# Patient Record
Sex: Female | Born: 1950
Health system: Southern US, Community
[De-identification: ages and names within clinical notes are randomized; demographics above are authoritative.]

## PROBLEM LIST (undated history)

## (undated) DIAGNOSIS — E785 Hyperlipidemia, unspecified: Secondary | ICD-10-CM

## (undated) DIAGNOSIS — N6092 Unspecified benign mammary dysplasia of left breast: Secondary | ICD-10-CM

## (undated) DIAGNOSIS — I10 Essential (primary) hypertension: Secondary | ICD-10-CM

## (undated) DIAGNOSIS — J45909 Unspecified asthma, uncomplicated: Secondary | ICD-10-CM

## (undated) HISTORY — PX: COLONOSCOPY: SHX174

## (undated) HISTORY — PX: ABDOMINAL HYSTERECTOMY: SHX81

## (undated) HISTORY — PX: CYSTECTOMY: SUR359

---

## 1997-05-28 ENCOUNTER — Ambulatory Visit: Admission: RE | Admit: 1997-05-28 | Discharge: 1997-05-28 | Payer: Self-pay | Admitting: General Surgery

## 1997-12-25 ENCOUNTER — Ambulatory Visit: Admission: RE | Admit: 1997-12-25 | Discharge: 1997-12-25 | Payer: Self-pay | Admitting: General Surgery

## 1997-12-25 ENCOUNTER — Encounter: Payer: Self-pay | Admitting: General Surgery

## 1999-01-02 ENCOUNTER — Encounter: Payer: Self-pay | Admitting: General Surgery

## 1999-01-02 ENCOUNTER — Encounter: Admission: RE | Admit: 1999-01-02 | Discharge: 1999-01-02 | Payer: Self-pay | Admitting: General Surgery

## 2000-08-02 ENCOUNTER — Encounter: Payer: Self-pay | Admitting: Obstetrics and Gynecology

## 2000-08-02 ENCOUNTER — Ambulatory Visit (HOSPITAL_COMMUNITY): Admission: RE | Admit: 2000-08-02 | Discharge: 2000-08-02 | Payer: Self-pay | Admitting: Obstetrics and Gynecology

## 2001-07-05 ENCOUNTER — Encounter: Payer: Self-pay | Admitting: Family Medicine

## 2001-07-05 ENCOUNTER — Ambulatory Visit (HOSPITAL_COMMUNITY): Admission: RE | Admit: 2001-07-05 | Discharge: 2001-07-05 | Payer: Self-pay | Admitting: Family Medicine

## 2001-10-31 ENCOUNTER — Emergency Department (HOSPITAL_COMMUNITY): Admission: EM | Admit: 2001-10-31 | Discharge: 2001-10-31 | Payer: Self-pay | Admitting: Emergency Medicine

## 2001-10-31 ENCOUNTER — Encounter: Payer: Self-pay | Admitting: Emergency Medicine

## 2001-12-11 ENCOUNTER — Emergency Department (HOSPITAL_COMMUNITY): Admission: EM | Admit: 2001-12-11 | Discharge: 2001-12-11 | Payer: Self-pay | Admitting: Emergency Medicine

## 2002-03-06 ENCOUNTER — Ambulatory Visit (HOSPITAL_COMMUNITY): Admission: RE | Admit: 2002-03-06 | Discharge: 2002-03-06 | Payer: Self-pay | Admitting: Pulmonary Disease

## 2003-08-12 ENCOUNTER — Emergency Department (HOSPITAL_COMMUNITY): Admission: EM | Admit: 2003-08-12 | Discharge: 2003-08-12 | Payer: Self-pay | Admitting: *Deleted

## 2003-08-14 ENCOUNTER — Emergency Department (HOSPITAL_COMMUNITY): Admission: EM | Admit: 2003-08-14 | Discharge: 2003-08-14 | Payer: Self-pay | Admitting: Family Medicine

## 2008-05-29 ENCOUNTER — Encounter: Payer: Self-pay | Admitting: Internal Medicine

## 2008-06-11 ENCOUNTER — Ambulatory Visit: Payer: Self-pay | Admitting: Internal Medicine

## 2008-06-11 ENCOUNTER — Ambulatory Visit (HOSPITAL_COMMUNITY): Admission: RE | Admit: 2008-06-11 | Discharge: 2008-06-11 | Payer: Self-pay | Admitting: Internal Medicine

## 2008-09-27 ENCOUNTER — Encounter: Payer: Self-pay | Admitting: Internal Medicine

## 2008-10-19 ENCOUNTER — Encounter: Payer: Self-pay | Admitting: Internal Medicine

## 2009-12-16 ENCOUNTER — Ambulatory Visit (HOSPITAL_COMMUNITY): Admission: RE | Admit: 2009-12-16 | Discharge: 2009-12-16 | Payer: Self-pay | Admitting: Internal Medicine

## 2010-06-03 NOTE — Op Note (Signed)
NAME:  Erika Baker, Erika Baker              ACCOUNT NO.:  [id number]   MEDICAL RECORD NO.:  [id number]          PATIENT TYPE:  AMB   LOCATION:  DAY                           FACILITY:  [facility]   PHYSICIAN:  R. Roetta Sessions, M.D. DATE OF BIRTH:  1950/11/24   DATE OF PROCEDURE:  06/11/2008  DATE OF DISCHARGE:                               OPERATIVE REPORT   PROCEDURE:  Screening colonoscopy.   INDICATIONS FOR PROCEDURE:  A 60 year old lady with no lower GI tract  symptoms sent over at the courtesy of Dr. Catalina Pizza for colorectal  cancer screening.  She is devoid of any lower GI tract symptoms.  There  is a family history of colon cancer.  She has never had her lower GI  tract imaged.  Colonoscopy is now being done as standard screening  maneuver.  Risks, benefits, alternatives, and limitations have been  discussed.  Questions were answered.  Please see the documentation in  the medical record.   PROCEDURE NOTE:  O2 saturation, blood pressure, pulse, and respirations  were monitored throughout the entire procedure.  Conscious sedation:  Versed 4 mg IV, Demerol 100 mg IV in divided doses.  Instrument:  Pentax  video chip system.   FINDINGS:  Digital rectal exam revealed no abnormalities.  The prep was  adequate.  Colon:  Colonic mucosa was surveyed from the rectosigmoid  junction, through the left transverse and right colon and the  appendiceal orifice, ileocecal valve, and cecum.  These structures were  well seen and photographed for the record.  From this level, the scope  was slowly withdrawn.  All previously mentioned mucosal surfaces were  again seen.  The colonic mucosa appeared normal.  The scope was pulled  down in the rectum where a thorough examination of the rectal mucosa  including retroflexed view of the anal verge demonstrated no  abnormalities.  The patient tolerated the procedure well and was reacted  from endoscopy.  Cecal withdrawal time 8 minutes.   IMPRESSION:  1. Normal  rectum.  2. Normal colon.   RECOMMENDATIONS:  A repeat screening colonoscopy in 10 years.      Jonathon Bellows, M.D.  Electronically Signed    RMR/MEDQ  D:  06/11/2008  T:  06/11/2008  Job:  [id number]   cc:   Catalina Pizza, M.D.  Fax: [phone]

## 2010-06-06 NOTE — Procedures (Signed)
   NAME:  Erika Baker, Erika Baker                        ACCOUNT NO.:  [id number]   MEDICAL RECORD NO.:  [id number]                   PATIENT TYPE:  OUT   LOCATION:  RESP                                 FACILITY:  [facility]   PHYSICIAN:  Edward L. Juanetta Gosling, M.D.             DATE OF BIRTH:  03/14/1950   DATE OF PROCEDURE:  DATE OF DISCHARGE:                              PULMONARY FUNCTION TEST   IMPRESSION:  1. Spirometry is normal.  2. Lung volumes are normal.  3. DLCO minimally reduced.                                               Edward L. Juanetta Gosling, M.D.    ELH/MEDQ  D:  03/07/2002  T:  03/07/2002  Job:  [id number]

## 2014-04-04 DIAGNOSIS — I10 Essential (primary) hypertension: Secondary | ICD-10-CM | POA: Diagnosis not present

## 2014-04-09 DIAGNOSIS — M79672 Pain in left foot: Secondary | ICD-10-CM | POA: Diagnosis not present

## 2014-04-09 DIAGNOSIS — E876 Hypokalemia: Secondary | ICD-10-CM | POA: Diagnosis not present

## 2014-04-09 DIAGNOSIS — I10 Essential (primary) hypertension: Secondary | ICD-10-CM | POA: Diagnosis not present

## 2014-04-09 DIAGNOSIS — E785 Hyperlipidemia, unspecified: Secondary | ICD-10-CM | POA: Diagnosis not present

## 2014-06-29 DIAGNOSIS — J449 Chronic obstructive pulmonary disease, unspecified: Secondary | ICD-10-CM | POA: Diagnosis not present

## 2014-06-29 DIAGNOSIS — I10 Essential (primary) hypertension: Secondary | ICD-10-CM | POA: Diagnosis not present

## 2014-06-29 DIAGNOSIS — J3089 Other allergic rhinitis: Secondary | ICD-10-CM | POA: Diagnosis not present

## 2014-10-09 DIAGNOSIS — E782 Mixed hyperlipidemia: Secondary | ICD-10-CM | POA: Diagnosis not present

## 2014-10-09 DIAGNOSIS — I10 Essential (primary) hypertension: Secondary | ICD-10-CM | POA: Diagnosis not present

## 2014-10-11 DIAGNOSIS — I10 Essential (primary) hypertension: Secondary | ICD-10-CM | POA: Diagnosis not present

## 2014-10-11 DIAGNOSIS — E782 Mixed hyperlipidemia: Secondary | ICD-10-CM | POA: Diagnosis not present

## 2014-10-23 DIAGNOSIS — Z1231 Encounter for screening mammogram for malignant neoplasm of breast: Secondary | ICD-10-CM | POA: Diagnosis not present

## 2014-11-08 DIAGNOSIS — I10 Essential (primary) hypertension: Secondary | ICD-10-CM | POA: Diagnosis not present

## 2014-11-08 DIAGNOSIS — Z23 Encounter for immunization: Secondary | ICD-10-CM | POA: Diagnosis not present

## 2015-05-08 DIAGNOSIS — E559 Vitamin D deficiency, unspecified: Secondary | ICD-10-CM | POA: Diagnosis not present

## 2015-05-08 DIAGNOSIS — D509 Iron deficiency anemia, unspecified: Secondary | ICD-10-CM | POA: Diagnosis not present

## 2015-05-08 DIAGNOSIS — E782 Mixed hyperlipidemia: Secondary | ICD-10-CM | POA: Diagnosis not present

## 2015-05-08 DIAGNOSIS — I10 Essential (primary) hypertension: Secondary | ICD-10-CM | POA: Diagnosis not present

## 2015-05-13 DIAGNOSIS — E785 Hyperlipidemia, unspecified: Secondary | ICD-10-CM | POA: Diagnosis not present

## 2015-05-13 DIAGNOSIS — I10 Essential (primary) hypertension: Secondary | ICD-10-CM | POA: Diagnosis not present

## 2015-06-28 DIAGNOSIS — J449 Chronic obstructive pulmonary disease, unspecified: Secondary | ICD-10-CM | POA: Diagnosis not present

## 2015-06-28 DIAGNOSIS — J3089 Other allergic rhinitis: Secondary | ICD-10-CM | POA: Diagnosis not present

## 2015-06-28 DIAGNOSIS — I10 Essential (primary) hypertension: Secondary | ICD-10-CM | POA: Diagnosis not present

## 2015-11-04 DIAGNOSIS — E782 Mixed hyperlipidemia: Secondary | ICD-10-CM | POA: Diagnosis not present

## 2015-11-06 DIAGNOSIS — Z23 Encounter for immunization: Secondary | ICD-10-CM | POA: Diagnosis not present

## 2015-11-06 DIAGNOSIS — I10 Essential (primary) hypertension: Secondary | ICD-10-CM | POA: Diagnosis not present

## 2015-11-06 DIAGNOSIS — Z0000 Encounter for general adult medical examination without abnormal findings: Secondary | ICD-10-CM | POA: Diagnosis not present

## 2015-11-06 DIAGNOSIS — Z6829 Body mass index (BMI) 29.0-29.9, adult: Secondary | ICD-10-CM | POA: Diagnosis not present

## 2015-11-06 DIAGNOSIS — E785 Hyperlipidemia, unspecified: Secondary | ICD-10-CM | POA: Diagnosis not present

## 2015-11-13 DIAGNOSIS — Z1231 Encounter for screening mammogram for malignant neoplasm of breast: Secondary | ICD-10-CM | POA: Diagnosis not present

## 2016-05-04 DIAGNOSIS — E785 Hyperlipidemia, unspecified: Secondary | ICD-10-CM | POA: Diagnosis not present

## 2016-05-04 DIAGNOSIS — I10 Essential (primary) hypertension: Secondary | ICD-10-CM | POA: Diagnosis not present

## 2016-05-06 DIAGNOSIS — E785 Hyperlipidemia, unspecified: Secondary | ICD-10-CM | POA: Diagnosis not present

## 2016-05-06 DIAGNOSIS — I10 Essential (primary) hypertension: Secondary | ICD-10-CM | POA: Diagnosis not present

## 2016-05-06 DIAGNOSIS — Z6830 Body mass index (BMI) 30.0-30.9, adult: Secondary | ICD-10-CM | POA: Diagnosis not present

## 2016-06-23 DIAGNOSIS — E669 Obesity, unspecified: Secondary | ICD-10-CM | POA: Diagnosis not present

## 2016-06-23 DIAGNOSIS — J449 Chronic obstructive pulmonary disease, unspecified: Secondary | ICD-10-CM | POA: Diagnosis not present

## 2016-06-23 DIAGNOSIS — I10 Essential (primary) hypertension: Secondary | ICD-10-CM | POA: Diagnosis not present

## 2016-06-23 DIAGNOSIS — J301 Allergic rhinitis due to pollen: Secondary | ICD-10-CM | POA: Diagnosis not present

## 2016-11-05 DIAGNOSIS — I10 Essential (primary) hypertension: Secondary | ICD-10-CM | POA: Diagnosis not present

## 2016-11-05 DIAGNOSIS — E785 Hyperlipidemia, unspecified: Secondary | ICD-10-CM | POA: Diagnosis not present

## 2016-11-09 DIAGNOSIS — E785 Hyperlipidemia, unspecified: Secondary | ICD-10-CM | POA: Diagnosis not present

## 2016-11-09 DIAGNOSIS — Z0000 Encounter for general adult medical examination without abnormal findings: Secondary | ICD-10-CM | POA: Diagnosis not present

## 2016-11-09 DIAGNOSIS — I10 Essential (primary) hypertension: Secondary | ICD-10-CM | POA: Diagnosis not present

## 2016-11-11 DIAGNOSIS — Z23 Encounter for immunization: Secondary | ICD-10-CM | POA: Diagnosis not present

## 2016-11-13 DIAGNOSIS — Z1231 Encounter for screening mammogram for malignant neoplasm of breast: Secondary | ICD-10-CM | POA: Diagnosis not present

## 2016-11-17 ENCOUNTER — Other Ambulatory Visit (HOSPITAL_COMMUNITY): Payer: Self-pay | Admitting: Internal Medicine

## 2016-11-17 DIAGNOSIS — Z780 Asymptomatic menopausal state: Secondary | ICD-10-CM

## 2016-11-26 ENCOUNTER — Other Ambulatory Visit (HOSPITAL_COMMUNITY): Payer: Self-pay

## 2016-11-26 ENCOUNTER — Encounter (HOSPITAL_COMMUNITY): Payer: Self-pay

## 2017-04-28 DIAGNOSIS — I10 Essential (primary) hypertension: Secondary | ICD-10-CM | POA: Diagnosis not present

## 2017-04-28 DIAGNOSIS — E876 Hypokalemia: Secondary | ICD-10-CM | POA: Diagnosis not present

## 2017-04-28 DIAGNOSIS — E782 Mixed hyperlipidemia: Secondary | ICD-10-CM | POA: Diagnosis not present

## 2017-04-30 DIAGNOSIS — E876 Hypokalemia: Secondary | ICD-10-CM | POA: Diagnosis not present

## 2017-04-30 DIAGNOSIS — E782 Mixed hyperlipidemia: Secondary | ICD-10-CM | POA: Diagnosis not present

## 2017-04-30 DIAGNOSIS — I10 Essential (primary) hypertension: Secondary | ICD-10-CM | POA: Diagnosis not present

## 2017-05-18 DIAGNOSIS — Z1212 Encounter for screening for malignant neoplasm of rectum: Secondary | ICD-10-CM | POA: Diagnosis not present

## 2017-05-18 DIAGNOSIS — Z1211 Encounter for screening for malignant neoplasm of colon: Secondary | ICD-10-CM | POA: Diagnosis not present

## 2017-06-23 ENCOUNTER — Ambulatory Visit (HOSPITAL_COMMUNITY)
Admission: EM | Admit: 2017-06-23 | Discharge: 2017-06-23 | Disposition: A | Discharge status: Home / Self Care | Payer: Medicare HMO | Attending: Internal Medicine | Admitting: Internal Medicine

## 2017-06-23 ENCOUNTER — Encounter (HOSPITAL_COMMUNITY): Payer: Self-pay | Admitting: Emergency Medicine

## 2017-06-23 DIAGNOSIS — J45909 Unspecified asthma, uncomplicated: Secondary | ICD-10-CM | POA: Insufficient documentation

## 2017-06-23 DIAGNOSIS — R1032 Left lower quadrant pain: Secondary | ICD-10-CM | POA: Insufficient documentation

## 2017-06-23 DIAGNOSIS — J301 Allergic rhinitis due to pollen: Secondary | ICD-10-CM | POA: Diagnosis not present

## 2017-06-23 DIAGNOSIS — I10 Essential (primary) hypertension: Secondary | ICD-10-CM | POA: Insufficient documentation

## 2017-06-23 DIAGNOSIS — R110 Nausea: Secondary | ICD-10-CM | POA: Diagnosis not present

## 2017-06-23 DIAGNOSIS — J449 Chronic obstructive pulmonary disease, unspecified: Secondary | ICD-10-CM | POA: Diagnosis not present

## 2017-06-23 DIAGNOSIS — E669 Obesity, unspecified: Secondary | ICD-10-CM | POA: Diagnosis not present

## 2017-06-23 DIAGNOSIS — Z79899 Other long term (current) drug therapy: Secondary | ICD-10-CM | POA: Diagnosis not present

## 2017-06-23 HISTORY — DX: Unspecified asthma, uncomplicated: J45.909

## 2017-06-23 HISTORY — DX: Essential (primary) hypertension: I10

## 2017-06-23 LAB — POCT URINALYSIS DIP (DEVICE)
Glucose, UA: NEGATIVE mg/dL
Ketones, ur: NEGATIVE mg/dL
NITRITE: NEGATIVE
PH: 6 (ref 5.0–8.0)
PROTEIN: 30 mg/dL — AB
Specific Gravity, Urine: 1.02 (ref 1.005–1.030)
Urobilinogen, UA: 0.2 mg/dL (ref 0.0–1.0)

## 2017-06-23 MED ORDER — CEPHALEXIN 500 MG PO CAPS: 500.0000 mg | ORAL_CAPSULE | Freq: Four times a day (QID) | ORAL | 0 refills | Status: AC

## 2017-06-23 MED ORDER — IBUPROFEN 600 MG PO TABS: 600.0000 mg | ORAL_TABLET | Freq: Four times a day (QID) | ORAL | 0 refills | Status: DC | PRN

## 2017-06-23 NOTE — Discharge Instructions (Addendum)
I am concerned about potentially urinary tract infection or kidney stone as possible source of your pain. I am starting you on antibiotics as your urine culture is pending. We would call you if changes to medications are indicated. Increase your water intake. Ibuprofen as needed for pain control. Please make appointment with your primary care clinic for recheck in the next few days. If you develop worsening of pain, vomiting, fevers or no improvement in the next 5 days please return or go to [facility] as you may need further imaging.

## 2017-06-23 NOTE — ED Provider Notes (Addendum)
[place]    CSN: [id number] Arrival date & time: 06/23/17  1246     History   Chief Complaint Chief Complaint  Patient presents with  . Abdominal Pain    HPI Erika Baker is a 67 y.o. female.   Vernell presents with complaints of LLQ abdominal pain which has been ongoing for the past three days. Much worse at night. Nausea at times but without vomiting. Denies any previous similar. No current pain. Pain started to left low back but now more to abdomen. Keeping her up at night. Has not taken any medications for pain. Eating and drinking. Normal urination. No diarrhea or constipation. States had a partial hysterectomy, still with ovaries. No vaginal symptoms. Nagging pain. No fevers. Taking two BP medications as well as atorvastatin. Hx of asthma as well.     ROS per HPI.      Past Medical History:  Diagnosis Date  . Asthma   . Hypertension     There are no active problems to display for this patient.   Past Surgical History:  Procedure Laterality Date  . ABDOMINAL HYSTERECTOMY     partial     OB History   None      Home Medications    Prior to Admission medications   Medication Sig Start Date End Date Taking? Authorizing Provider  atorvastatin (LIPITOR) 10 MG tablet Take by mouth daily.   Yes [provider]  cephALEXin (KEFLEX) 500 MG capsule Take 1 capsule (500 mg total) by mouth 4 (four) times daily for 7 days. 06/23/17 06/30/17  Zigmund Gottron, NP  ibuprofen (ADVIL,MOTRIN) 600 MG tablet Take 1 tablet (600 mg total) by mouth every 6 (six) hours as needed. 06/23/17   Zigmund Gottron, NP    Family History History reviewed. No pertinent family history.  Social History Social History   Tobacco Use  . Smoking status: Never Smoker  . Smokeless tobacco: Never Used  Substance Use Topics  . Alcohol use: Not on file  . Drug use: Not on file     Allergies   Patient has no known allergies.   Review of Systems Review of  Systems   Physical Exam Triage Vital Signs ED Triage Vitals [06/23/17 1312]  Enc Vitals Group     BP (!) 149/86     Pulse Rate 75     Resp 18     Temp 98.2 F (36.8 C)     Temp Source Oral     SpO2 97 %     Weight      Height      Head Circumference      Peak Flow      Pain Score      Pain Loc      Pain Edu?      Excl. in [place]?    No data found.  Updated Vital Signs BP (!) 149/86 (BP Location: Left Arm)   Pulse 75   Temp 98.2 F (36.8 C) (Oral)   Resp 18   SpO2 97%    Physical Exam  Constitutional: She is oriented to person, place, and time. She appears well-developed and well-nourished. No distress.  Cardiovascular: Normal rate, regular rhythm and normal heart sounds.  Pulmonary/Chest: Effort normal and breath sounds normal.  Abdominal: Soft. Bowel sounds are normal. There is no hepatosplenomegaly, splenomegaly or hepatomegaly. There is tenderness in the left lower quadrant. There is no rigidity, no rebound, no guarding, no CVA tenderness, no tenderness  at McBurney's point and negative Murphy's sign. No hernia.  Very minimal pain on palpation; patient is alert, active, without distress, ambulatory and moving around without difficulty without any objective indications of discomfort   Neurological: She is alert and oriented to person, place, and time.  Skin: Skin is warm and dry.     UC Treatments / Results  Labs (all labs ordered are listed, but only abnormal results are displayed) Labs Reviewed  POCT URINALYSIS DIP (DEVICE) - Abnormal; Notable for the following components:      Result Value   Bilirubin Urine SMALL (*)    Hgb urine dipstick SMALL (*)    Protein, ur 30 (*)    Leukocytes, UA SMALL (*)    All other components within normal limits  URINE CULTURE    EKG None  Radiology No results found.  Procedures Procedures (including critical care time)  Medications Ordered in UC Medications - No data to display  Initial Impression / Assessment and  Plan / UC Course  I have reviewed the triage vital signs and the nursing notes.  Pertinent labs & imaging results that were available during my care of the patient were reviewed by me and considered in my medical decision making (see chart for details).     Diverticulitis, ovarian cyst, kidney stone, uti considered and discussed with patient. Pain worse at night. Currently without pain. Afebrile, without tachycardia. leuks and hgb to urine, concerning for uti vs kidney stone. Opted to iniitate antibiotics at this time with close follow up with pcp as may need imagining if symptoms persist. Urine culture pending. Return precautions provided. Patient verbalized understanding and agreeable to plan.  Ambulatory out of clinic without difficulty.     Final Clinical Impressions(s) / UC Diagnoses   Final diagnoses:  Abdominal pain, left lower quadrant     Discharge Instructions     I am concerned about potentially urinary tract infection or kidney stone as possible source of your pain. I am starting you on antibiotics as your urine culture is pending. We would call you if changes to medications are indicated. Increase your water intake. Ibuprofen as needed for pain control. Please make appointment with your primary care clinic for recheck in the next few days. If you develop worsening of pain, vomiting, fevers or no improvement in the next 5 days please return or go to Er as you may need further imaging.    ED Prescriptions    Medication Sig Dispense Auth. Provider   cephALEXin (KEFLEX) 500 MG capsule Take 1 capsule (500 mg total) by mouth 4 (four) times daily for 7 days. 28 capsule Augusto Gamble B, NP   ibuprofen (ADVIL,MOTRIN) 600 MG tablet Take 1 tablet (600 mg total) by mouth every 6 (six) hours as needed. 30 tablet Zigmund Gottron, NP     Controlled Substance Prescriptions [place]rolled Sub[place] Registry consulted? Not Applicable      Zigmund Gottron, NP 06/23/17 1344

## 2017-06-23 NOTE — ED Triage Notes (Signed)
Pt sts left sided abd pain that started in back and moved around to front

## 2017-06-24 LAB — URINE CULTURE

## 2017-06-25 ENCOUNTER — Encounter (HOSPITAL_COMMUNITY): Payer: Self-pay | Admitting: *Deleted

## 2017-06-25 ENCOUNTER — Emergency Department (HOSPITAL_COMMUNITY): Payer: Medicare HMO

## 2017-06-25 ENCOUNTER — Emergency Department (HOSPITAL_COMMUNITY)
Admission: EM | Admit: 2017-06-25 | Discharge: 2017-06-25 | Disposition: A | Discharge status: Home / Self Care | Payer: Medicare HMO | Attending: Emergency Medicine | Admitting: Emergency Medicine

## 2017-06-25 ENCOUNTER — Other Ambulatory Visit: Payer: Self-pay

## 2017-06-25 DIAGNOSIS — Z79899 Other long term (current) drug therapy: Secondary | ICD-10-CM | POA: Insufficient documentation

## 2017-06-25 DIAGNOSIS — R0789 Other chest pain: Secondary | ICD-10-CM | POA: Insufficient documentation

## 2017-06-25 DIAGNOSIS — I10 Essential (primary) hypertension: Secondary | ICD-10-CM | POA: Diagnosis not present

## 2017-06-25 DIAGNOSIS — R079 Chest pain, unspecified: Secondary | ICD-10-CM | POA: Diagnosis not present

## 2017-06-25 DIAGNOSIS — R110 Nausea: Secondary | ICD-10-CM | POA: Diagnosis not present

## 2017-06-25 DIAGNOSIS — J45909 Unspecified asthma, uncomplicated: Secondary | ICD-10-CM | POA: Insufficient documentation

## 2017-06-25 LAB — BASIC METABOLIC PANEL
Anion gap: 10 (ref 5–15)
BUN: 15 mg/dL (ref 6–20)
CO2: 26 mmol/L (ref 22–32)
Calcium: 9.8 mg/dL (ref 8.9–10.3)
Chloride: 104 mmol/L (ref 101–111)
Creatinine, Ser: 0.97 mg/dL (ref 0.44–1.00)
GFR calc non Af Amer: 59 mL/min — ABNORMAL LOW (ref >60)
Glucose, Bld: 112 mg/dL — ABNORMAL HIGH (ref 65–99)
POTASSIUM: 3.6 mmol/L (ref 3.5–5.1)
SODIUM: 140 mmol/L (ref 135–145)

## 2017-06-25 LAB — I-STAT TROPONIN, ED
Troponin i, poc: 0.01 ng/mL (ref 0.00–0.08)
Troponin i, poc: 0 ng/mL (ref 0.00–0.08)

## 2017-06-25 LAB — CBC
HCT: 47.8 % — ABNORMAL HIGH (ref 36.0–46.0)
Hemoglobin: 15.9 g/dL — ABNORMAL HIGH (ref 12.0–15.0)
MCH: 30.4 pg (ref 26.0–34.0)
MCHC: 33.3 g/dL (ref 30.0–36.0)
MCV: 91.4 fL (ref 78.0–100.0)
Platelets: 256 10*3/uL (ref 150–400)
RBC: 5.23 MIL/uL — AB (ref 3.87–5.11)
RDW: 13.7 % (ref 11.5–15.5)
WBC: 7.8 10*3/uL (ref 4.0–10.5)

## 2017-06-25 LAB — HEPATIC FUNCTION PANEL
ALT: 23 U/L (ref 14–54)
AST: 28 U/L (ref 15–41)
Albumin: 4.3 g/dL (ref 3.5–5.0)
Alkaline Phosphatase: 74 U/L (ref 38–126)
Bilirubin, Direct: <0.1 mg/dL — ABNORMAL LOW (ref 0.1–0.5)
TOTAL PROTEIN: 7.2 g/dL (ref 6.5–8.1)
Total Bilirubin: 1 mg/dL (ref 0.3–1.2)

## 2017-06-25 LAB — LIPASE, BLOOD: LIPASE: 33 U/L (ref 11–51)

## 2017-06-25 MED ORDER — GI COCKTAIL ~~LOC~~
30.0000 mL | Freq: Once | ORAL | Status: AC
  Administered 2017-06-25: 30 mL via ORAL
  Filled 2017-06-25: qty 30

## 2017-06-25 MED ORDER — FAMOTIDINE 40 MG PO TABS: 40.0000 mg | ORAL_TABLET | Freq: Every day | ORAL | 0 refills | Status: DC

## 2017-06-25 NOTE — ED Provider Notes (Signed)
[facility] EMERGENCY DEPARTMENT Provider Note  CSN: [id number] Arrival date & time: 06/25/17 3664  Chief Complaint(s) Chest Pain and Abdominal Pain  HPI Erika Baker is a 67 y.o. female   The history is provided by the patient.  Chest Pain   This is a new problem. The current episode started 2 days ago. Episode frequency: nightly when she lies down. The problem has not changed since onset.The pain is present in the substernal region. The pain is mild. The quality of the pain is described as dull and pressure-like. The pain does not radiate. The symptoms are aggravated by certain positions. Associated symptoms include abdominal pain (migratory pain, mostly on the left which improves with lying down and "moves to my chest") and nausea. Pertinent negatives include no back pain, no cough, no exertional chest pressure, no fever, no leg pain, no lower extremity edema, no malaise/fatigue, no orthopnea, no shortness of breath and no vomiting. Risk factors include being elderly.  Her past medical history is significant for hyperlipidemia and hypertension.  Pertinent negatives for past medical history include no CAD, no CHF, no diabetes, no DVT, no MI, no PE and no strokes.  Abdominal Pain   Associated symptoms include nausea. Pertinent negatives include fever and vomiting.   Seen by her PCP 2 days ago for the abd pain and felt to by a possible UTI or renal stones. Rx Motrin and Keflex. Chest ain began that night.  Past Medical History Past Medical History:  Diagnosis Date  . Asthma   . Hypertension    There are no active problems to display for this patient.  Home Medication(s) Prior to Admission medications   Medication Sig Start Date End Date Taking? Authorizing Provider  amLODipine (NORVASC) 10 MG tablet Take 10 mg by mouth daily.   Yes [provider]  atorvastatin (LIPITOR) 10 MG tablet Take 40 mg by mouth daily.    Yes [provider]  cephALEXin  (KEFLEX) 500 MG capsule Take 1 capsule (500 mg total) by mouth 4 (four) times daily for 7 days. 06/23/17 06/30/17 Yes Burky, Malachy Moan, NP  ibuprofen (ADVIL,MOTRIN) 600 MG tablet Take 1 tablet (600 mg total) by mouth every 6 (six) hours as needed. Patient taking differently: Take 600 mg by mouth every 6 (six) hours as needed for moderate pain.  06/23/17  Yes Burky, Lanelle Bal B, NP  losartan (COZAAR) 25 MG tablet Take 25 mg by mouth daily.   Yes [provider]  famotidine (PEPCID) 40 MG tablet Take 1 tablet (40 mg total) by mouth daily. 06/25/17   Fatima Blank, MD                                                                                                                                    Past Surgical History Past Surgical History:  Procedure Laterality Date  . ABDOMINAL HYSTERECTOMY     partial  Family History No family history on file.  Social History Social History   Tobacco Use  . Smoking status: Never Smoker  . Smokeless tobacco: Never Used  Substance Use Topics  . Alcohol use: Not on file  . Drug use: Not on file   Allergies Patient has no known allergies.  Review of Systems Review of Systems  Constitutional: Negative for fever and malaise/fatigue.  Respiratory: Negative for cough and shortness of breath.   Cardiovascular: Positive for chest pain. Negative for orthopnea.  Gastrointestinal: Positive for abdominal pain (migratory pain, mostly on the left which improves with lying down and "moves to my chest") and nausea. Negative for vomiting.  Musculoskeletal: Negative for back pain.   All other systems are reviewed and are negative for acute change except as noted in the HPI  Physical Exam Vital Signs  I have reviewed the triage vital signs BP (!) 163/84   Pulse 67   Temp 98.4 F (36.9 C)   Resp 16   SpO2 99%   Physical Exam  Constitutional: She is oriented to person, place, and time. She appears well-developed and well-nourished. No distress.    HENT:  Head: Normocephalic and atraumatic.  Nose: Nose normal.  Eyes: Pupils are equal, round, and reactive to light. Conjunctivae and EOM are normal. Right eye exhibits no discharge. Left eye exhibits no discharge. No scleral icterus.  Neck: Normal range of motion. Neck supple.  Cardiovascular: Normal rate and regular rhythm. Exam reveals no gallop and no friction rub.  No murmur heard. Pulmonary/Chest: Effort normal and breath sounds normal. No stridor. No respiratory distress. She has no rales.  Abdominal: Soft. She exhibits no distension. There is no tenderness.  Musculoskeletal: She exhibits no edema or tenderness.  Neurological: She is alert and oriented to person, place, and time.  Skin: Skin is warm and dry. No rash noted. She is not diaphoretic. No erythema.  Psychiatric: She has a normal mood and affect.  Vitals reviewed.   ED Results and Treatments Labs (all labs ordered are listed, but only abnormal results are displayed) Labs Reviewed  BASIC METABOLIC PANEL - Abnormal; Notable for the following components:      Result Value   Glucose, Bld 112 (*)    GFR calc non Af Amer 59 (*)    All other components within normal limits  CBC - Abnormal; Notable for the following components:   RBC 5.23 (*)    Hemoglobin 15.9 (*)    HCT 47.8 (*)    All other components within normal limits  HEPATIC FUNCTION PANEL - Abnormal; Notable for the following components:   Bilirubin, Direct <0.1 (*)    All other components within normal limits  LIPASE, BLOOD  I-STAT TROPONIN, ED  I-STAT TROPONIN, ED                                                                                                                         EKG  EKG Interpretation  Date/Time: 06/25/2017  Ventricular Rate: 71   PR Interval: 166   QRS Duration:84   QT Interval: 392   QTC Calculation:425   R Axis:  60   Text Interpretation: Normal sinus rhythm. NO STEMI.  No prior tracing for comparison.       Radiology Dg Chest 2 View  Result Date: 06/25/2017 CLINICAL DATA:  Acute onset of left-sided abdominal pain. Central chest pain. EXAM: CHEST - 2 VIEW COMPARISON:  None. FINDINGS: The lungs are well-aerated. Minimal right basilar atelectasis is noted. There is no evidence of pleural effusion or pneumothorax. The heart is normal in size; the mediastinal contour is within normal limits. No acute osseous abnormalities are seen. IMPRESSION: Minimal right basilar atelectasis noted; lungs otherwise clear. Electronically Signed   By: Garald Balding M.D.   On: 06/25/2017 04:35   Pertinent labs & imaging results that were available during my care of the patient were reviewed by me and considered in my medical decision making (see chart for details).  Medications Ordered in ED Medications  gi cocktail (Maalox,Lidocaine,Donnatal) (30 mLs Oral Given 06/25/17 0549)                                                                                                                                    Procedures Procedures  (including critical care time)  Medical Decision Making / ED Course I have reviewed the nursing notes for this encounter and the patient's prior records (if available in EHR or on provided paperwork).    Atypical chest pain highly inconsistent with ACS.  EKG without acute ischemic changes or evidence of pericarditis.  Initial troponin negative.  Heart score less than 4.  Feel she is appropriate for delta troponin to rule out ACS which we will do so given her age and comorbidities.  Doubt PE, aortic dissection or esophageal perforation.  Chest x-ray without evidence suggestive of pneumonia, pneumothorax, pneumomediastinum.  No abnormal contour of the mediastinum to suggest dissection. No evidence of acute injuries.  Possible GI in nature.  She was given GI cocktail resulting in complete resolution of her symptomatology.  Rest of the labs grossly reassuring without leukocytosis, significant  electrolyte derangement.  No evidence of biliary obstruction or pancreatitis.  Delta troponin negative.  The patient appears reasonably screened and/or stabilized for discharge and I doubt any other medical condition or other [facility] requiring further screening, evaluation, or treatment in the ED at this time prior to discharge.  The patient is safe for discharge with strict return precautions.    Final Clinical Impression(s) / ED Diagnoses Final diagnoses:  Atypical chest pain   Disposition: Discharge  Condition: Good  I have discussed the results, Dx and Tx plan with the patient who expressed understanding and agree(s) with the plan. Discharge instructions discussed at great length. The patient was given strict return precautions who verbalized understanding of the instructions. No further questions at time of discharge.    ED  Discharge Orders        Ordered    famotidine (PEPCID) 40 MG tablet  Daily     06/25/17 2233       Follow Up: Celene Squibb, MD [place] [address] [phone]  Schedule an appointment as soon as possible for a visit  As needed      This chart was dictated using voice recognition software.  Despite best efforts to proofread,  errors can occur which can change the documentation meaning.   Fatima Blank, MD 06/25/17 [phone]

## 2017-06-25 NOTE — ED Triage Notes (Signed)
Pt was seen at UC 2 days ago for Lsided abd pain radiating to the front. Pt woke up this morning with centralized CP, denies sob or NV.

## 2017-07-01 ENCOUNTER — Other Ambulatory Visit (HOSPITAL_COMMUNITY): Payer: Self-pay | Admitting: Nurse Practitioner

## 2017-07-01 ENCOUNTER — Ambulatory Visit (HOSPITAL_COMMUNITY)
Admission: RE | Admit: 2017-07-01 | Discharge: 2017-07-01 | Disposition: A | Discharge status: Home / Self Care | Payer: Medicare HMO | Source: Ambulatory Visit | Attending: Nurse Practitioner | Admitting: Nurse Practitioner

## 2017-07-01 DIAGNOSIS — M545 Low back pain: Secondary | ICD-10-CM

## 2017-07-01 DIAGNOSIS — M5136 Other intervertebral disc degeneration, lumbar region: Secondary | ICD-10-CM | POA: Diagnosis not present

## 2017-07-01 DIAGNOSIS — M160 Bilateral primary osteoarthritis of hip: Secondary | ICD-10-CM | POA: Insufficient documentation

## 2017-07-01 DIAGNOSIS — M25552 Pain in left hip: Secondary | ICD-10-CM

## 2017-07-01 DIAGNOSIS — M47816 Spondylosis without myelopathy or radiculopathy, lumbar region: Secondary | ICD-10-CM | POA: Diagnosis not present

## 2017-07-01 DIAGNOSIS — M5137 Other intervertebral disc degeneration, lumbosacral region: Secondary | ICD-10-CM | POA: Insufficient documentation

## 2017-07-01 DIAGNOSIS — Z6829 Body mass index (BMI) 29.0-29.9, adult: Secondary | ICD-10-CM | POA: Diagnosis not present

## 2017-07-01 DIAGNOSIS — M4316 Spondylolisthesis, lumbar region: Secondary | ICD-10-CM | POA: Diagnosis not present

## 2017-07-01 DIAGNOSIS — L03317 Cellulitis of buttock: Secondary | ICD-10-CM | POA: Diagnosis not present

## 2017-07-01 DIAGNOSIS — M25551 Pain in right hip: Secondary | ICD-10-CM | POA: Diagnosis not present

## 2017-07-01 IMAGING — DX DG HIP (WITH OR WITHOUT PELVIS) 2-3V*L*
3 series · 3 of 3 positions shown · non-contrast
Comparison: None.

CLINICAL DATA: Back and left hip pain for 2 weeks, no injury

EXAM:
DG HIP (WITH OR WITHOUT PELVIS) 2-3V LEFT

[pelvis ap]
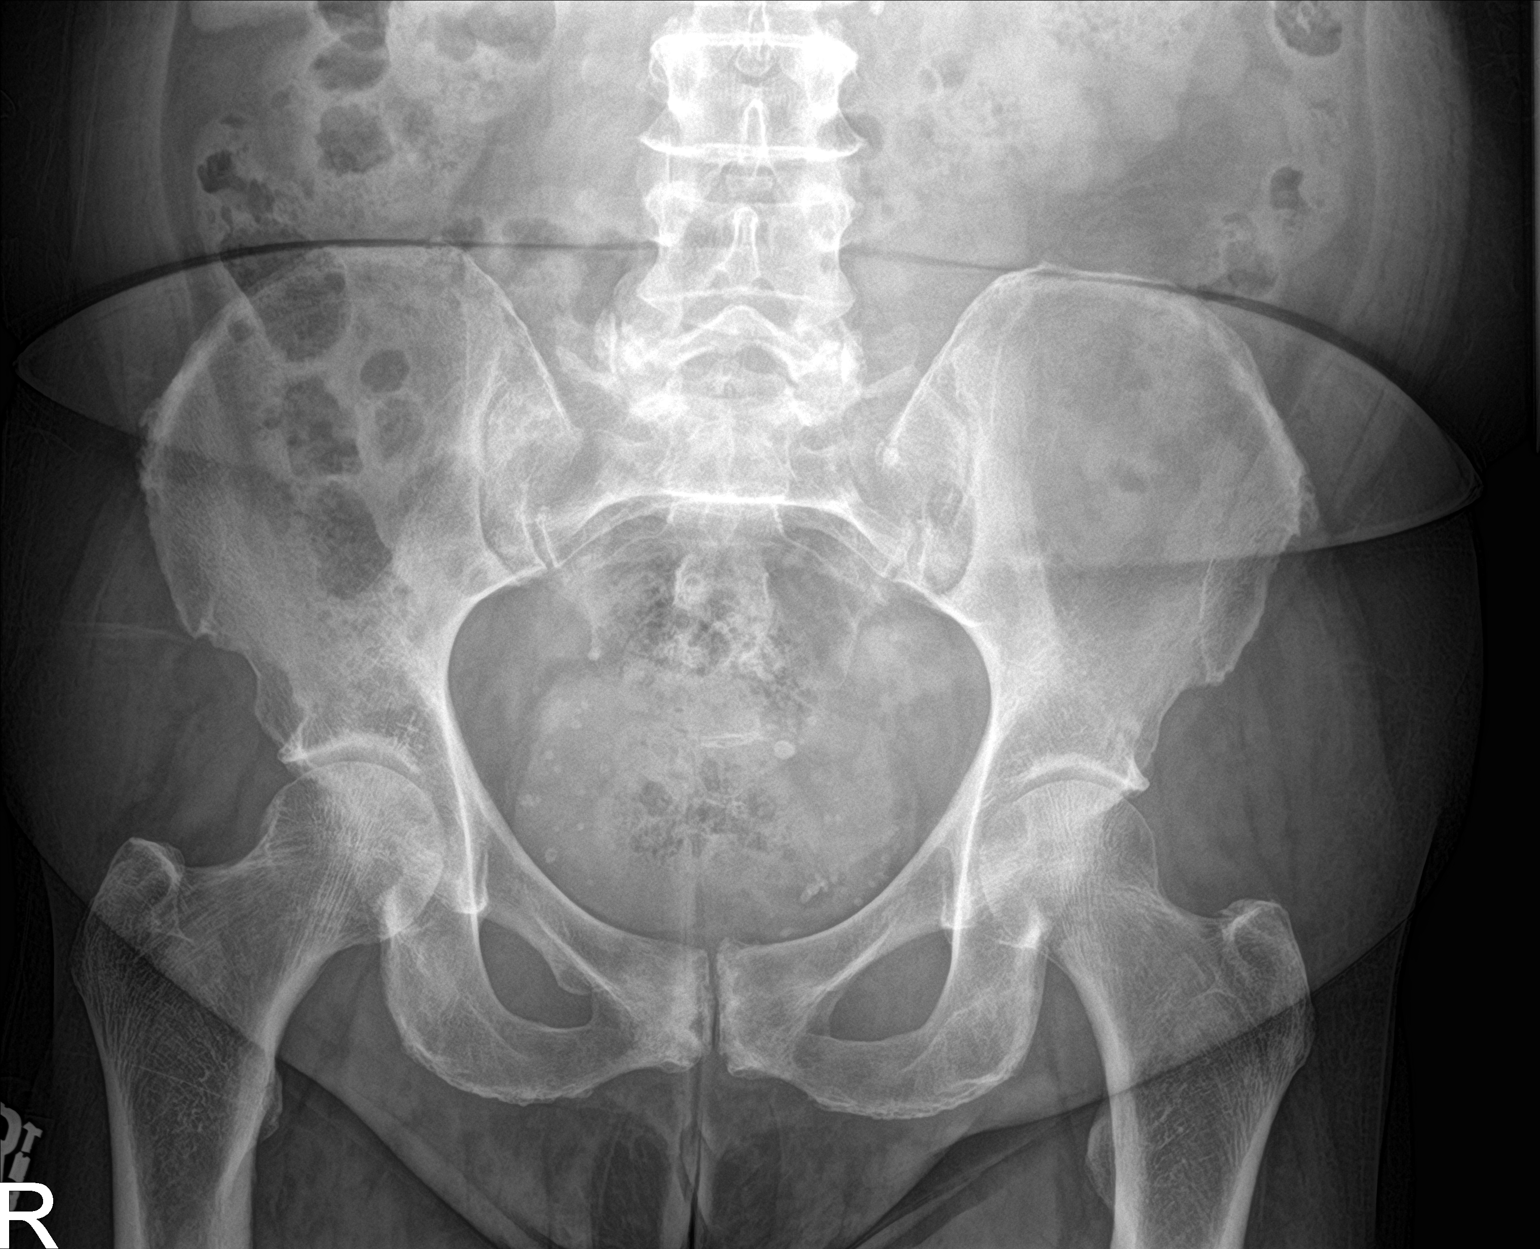

[hip ap]
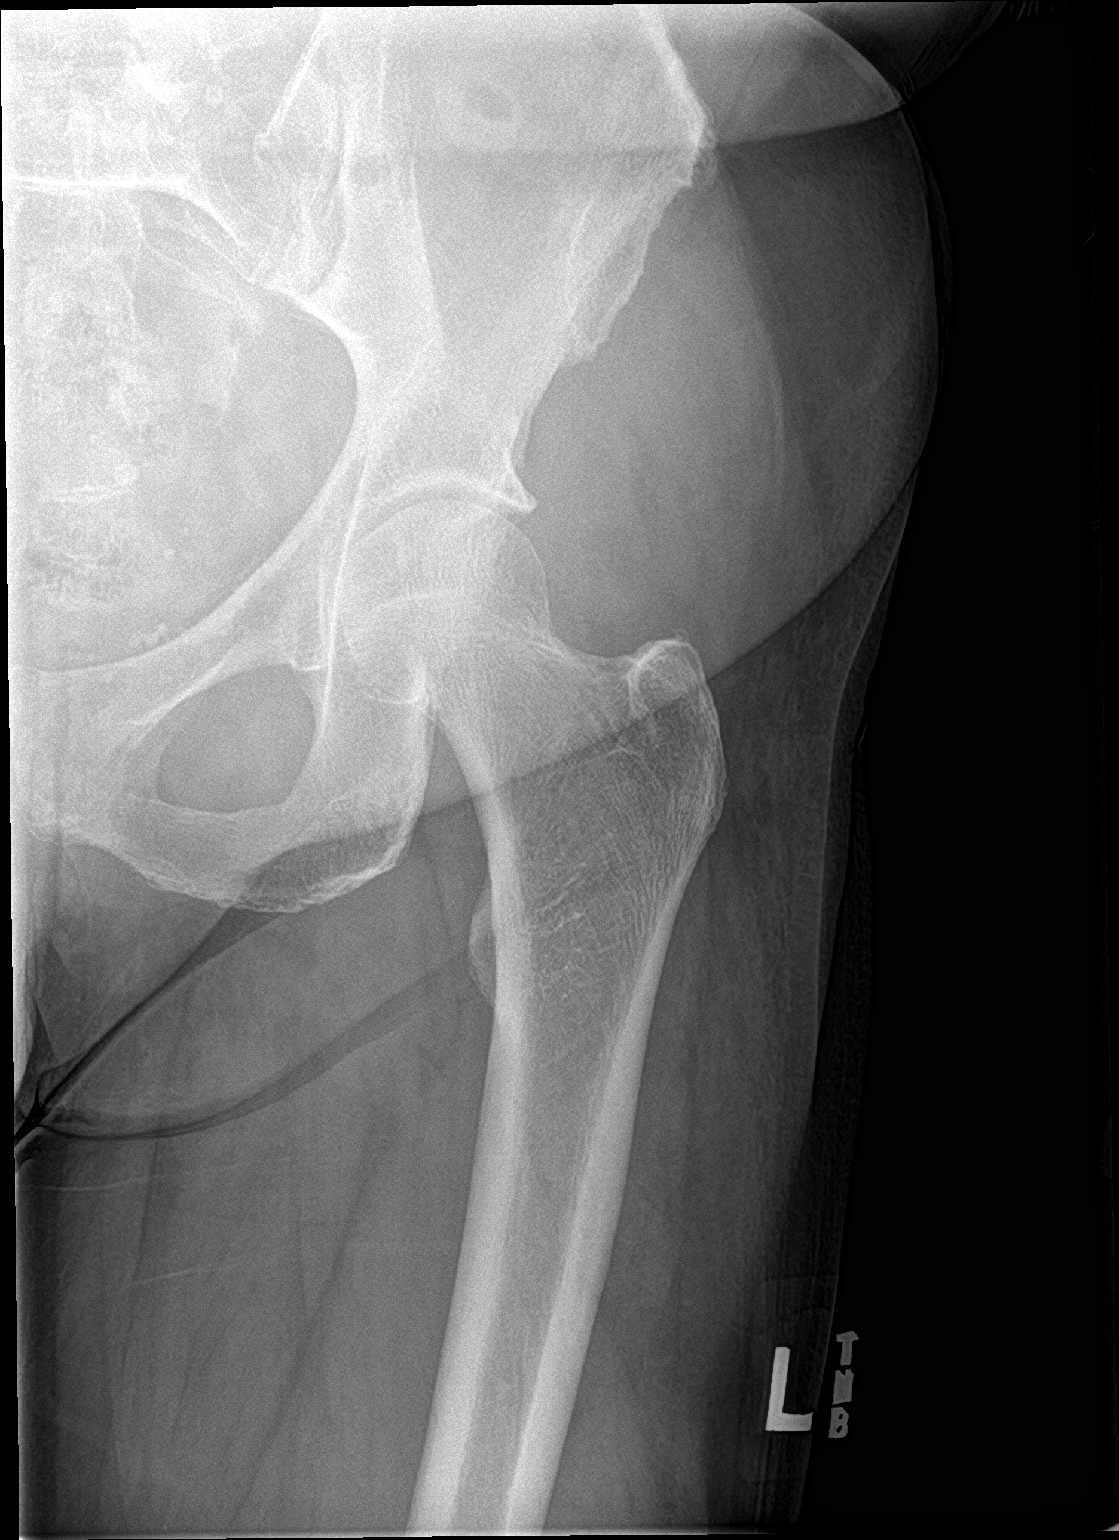

[hip lat]
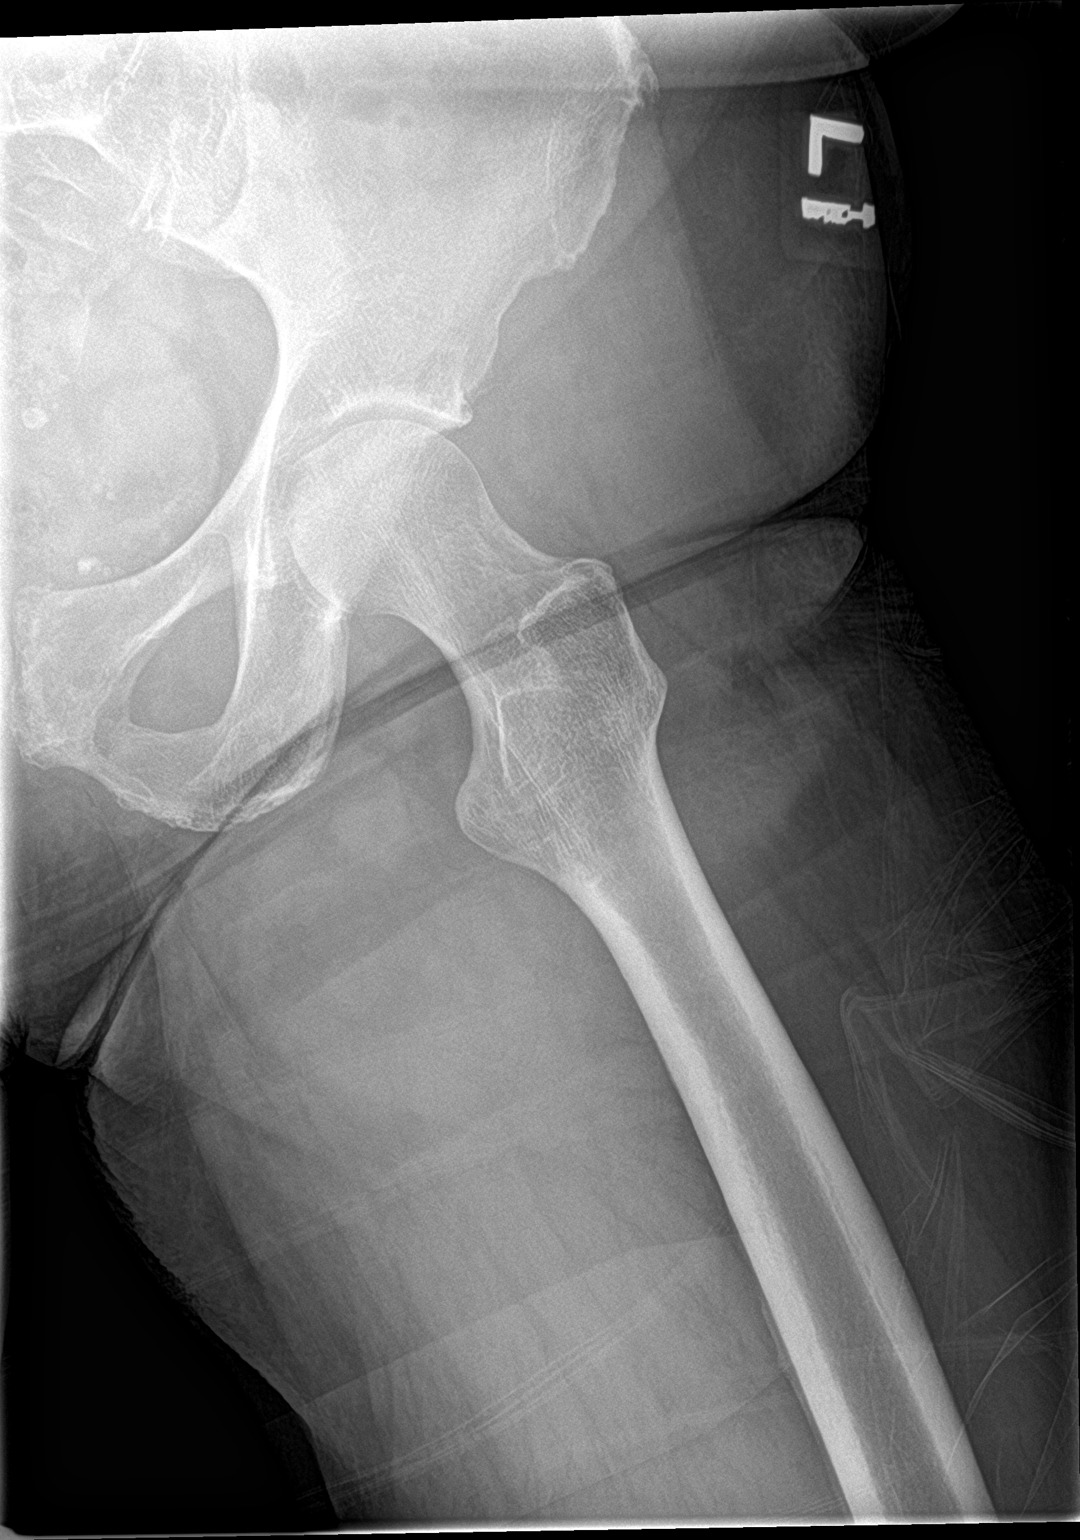

[3 of 3 positions shown; findings below may reference images not displayed]

FINDINGS: There is only mild degenerative joint disease of the hips with mild
acetabular spurring present. No fracture is seen. There is some
degenerative change of the symphysis pubis as well. But the pubic
rami appear intact. The SI joints are corticated. Calcified
phleboliths are present in the pelvis. The bowel gas pattern is
nonspecific.
IMPRESSION: 1. Only mild degenerative joint disease of the hips. No acute
abnormality.
2. Mild degenerative change of the pubic symphysis.

## 2017-07-15 DIAGNOSIS — Z6829 Body mass index (BMI) 29.0-29.9, adult: Secondary | ICD-10-CM | POA: Diagnosis not present

## 2017-07-15 DIAGNOSIS — E782 Mixed hyperlipidemia: Secondary | ICD-10-CM | POA: Diagnosis not present

## 2017-07-15 DIAGNOSIS — E876 Hypokalemia: Secondary | ICD-10-CM | POA: Diagnosis not present

## 2017-07-15 DIAGNOSIS — L03317 Cellulitis of buttock: Secondary | ICD-10-CM | POA: Diagnosis not present

## 2017-07-15 DIAGNOSIS — I10 Essential (primary) hypertension: Secondary | ICD-10-CM | POA: Diagnosis not present

## 2017-07-15 DIAGNOSIS — R100 Acute abdomen: Secondary | ICD-10-CM | POA: Diagnosis not present

## 2017-11-09 DIAGNOSIS — E559 Vitamin D deficiency, unspecified: Secondary | ICD-10-CM | POA: Diagnosis not present

## 2017-11-09 DIAGNOSIS — E782 Mixed hyperlipidemia: Secondary | ICD-10-CM | POA: Diagnosis not present

## 2017-11-09 DIAGNOSIS — I10 Essential (primary) hypertension: Secondary | ICD-10-CM | POA: Diagnosis not present

## 2017-11-09 DIAGNOSIS — E876 Hypokalemia: Secondary | ICD-10-CM | POA: Diagnosis not present

## 2017-11-12 DIAGNOSIS — Z6830 Body mass index (BMI) 30.0-30.9, adult: Secondary | ICD-10-CM | POA: Diagnosis not present

## 2017-11-12 DIAGNOSIS — I10 Essential (primary) hypertension: Secondary | ICD-10-CM | POA: Diagnosis not present

## 2017-11-12 DIAGNOSIS — Z0000 Encounter for general adult medical examination without abnormal findings: Secondary | ICD-10-CM | POA: Diagnosis not present

## 2017-11-12 DIAGNOSIS — Z23 Encounter for immunization: Secondary | ICD-10-CM | POA: Diagnosis not present

## 2017-11-12 DIAGNOSIS — E782 Mixed hyperlipidemia: Secondary | ICD-10-CM | POA: Diagnosis not present

## 2017-11-16 ENCOUNTER — Other Ambulatory Visit: Payer: Self-pay | Admitting: Adult Health Nurse Practitioner

## 2017-11-16 DIAGNOSIS — R5381 Other malaise: Secondary | ICD-10-CM

## 2017-11-17 DIAGNOSIS — Z1231 Encounter for screening mammogram for malignant neoplasm of breast: Secondary | ICD-10-CM | POA: Diagnosis not present

## 2017-11-24 ENCOUNTER — Other Ambulatory Visit: Payer: Self-pay | Admitting: Adult Health Nurse Practitioner

## 2017-11-24 DIAGNOSIS — E2839 Other primary ovarian failure: Secondary | ICD-10-CM

## 2017-12-07 ENCOUNTER — Ambulatory Visit
Admission: RE | Admit: 2017-12-07 | Discharge: 2017-12-07 | Disposition: A | Discharge status: Home / Self Care | Payer: Medicare HMO | Source: Ambulatory Visit | Attending: Adult Health Nurse Practitioner | Admitting: Adult Health Nurse Practitioner

## 2017-12-07 DIAGNOSIS — E2839 Other primary ovarian failure: Secondary | ICD-10-CM

## 2017-12-07 DIAGNOSIS — M85851 Other specified disorders of bone density and structure, right thigh: Secondary | ICD-10-CM | POA: Diagnosis not present

## 2018-05-13 DIAGNOSIS — E782 Mixed hyperlipidemia: Secondary | ICD-10-CM | POA: Diagnosis not present

## 2018-05-13 DIAGNOSIS — E559 Vitamin D deficiency, unspecified: Secondary | ICD-10-CM | POA: Diagnosis not present

## 2018-05-13 DIAGNOSIS — I10 Essential (primary) hypertension: Secondary | ICD-10-CM | POA: Diagnosis not present

## 2018-05-17 DIAGNOSIS — E559 Vitamin D deficiency, unspecified: Secondary | ICD-10-CM | POA: Diagnosis not present

## 2018-05-17 DIAGNOSIS — E782 Mixed hyperlipidemia: Secondary | ICD-10-CM | POA: Diagnosis not present

## 2018-05-17 DIAGNOSIS — E870 Hyperosmolality and hypernatremia: Secondary | ICD-10-CM | POA: Diagnosis not present

## 2018-05-17 DIAGNOSIS — I10 Essential (primary) hypertension: Secondary | ICD-10-CM | POA: Diagnosis not present

## 2018-05-20 DIAGNOSIS — Z0000 Encounter for general adult medical examination without abnormal findings: Secondary | ICD-10-CM | POA: Diagnosis not present

## 2018-05-25 ENCOUNTER — Encounter: Payer: Self-pay | Admitting: Internal Medicine

## 2018-06-29 DIAGNOSIS — J683 Other acute and subacute respiratory conditions due to chemicals, gases, fumes and vapors: Secondary | ICD-10-CM | POA: Diagnosis not present

## 2018-06-29 DIAGNOSIS — J301 Allergic rhinitis due to pollen: Secondary | ICD-10-CM | POA: Diagnosis not present

## 2018-06-29 DIAGNOSIS — I10 Essential (primary) hypertension: Secondary | ICD-10-CM | POA: Diagnosis not present

## 2018-11-17 DIAGNOSIS — I10 Essential (primary) hypertension: Secondary | ICD-10-CM | POA: Diagnosis not present

## 2018-11-17 DIAGNOSIS — E782 Mixed hyperlipidemia: Secondary | ICD-10-CM | POA: Diagnosis not present

## 2018-11-17 DIAGNOSIS — E559 Vitamin D deficiency, unspecified: Secondary | ICD-10-CM | POA: Diagnosis not present

## 2018-11-21 DIAGNOSIS — Z1231 Encounter for screening mammogram for malignant neoplasm of breast: Secondary | ICD-10-CM | POA: Diagnosis not present

## 2018-11-21 DIAGNOSIS — Z0000 Encounter for general adult medical examination without abnormal findings: Secondary | ICD-10-CM | POA: Diagnosis not present

## 2018-11-21 DIAGNOSIS — I10 Essential (primary) hypertension: Secondary | ICD-10-CM | POA: Diagnosis not present

## 2018-11-21 DIAGNOSIS — E559 Vitamin D deficiency, unspecified: Secondary | ICD-10-CM | POA: Diagnosis not present

## 2018-11-21 DIAGNOSIS — E870 Hyperosmolality and hypernatremia: Secondary | ICD-10-CM | POA: Diagnosis not present

## 2018-11-21 DIAGNOSIS — Z23 Encounter for immunization: Secondary | ICD-10-CM | POA: Diagnosis not present

## 2018-11-21 DIAGNOSIS — E782 Mixed hyperlipidemia: Secondary | ICD-10-CM | POA: Diagnosis not present

## 2019-02-23 DIAGNOSIS — E782 Mixed hyperlipidemia: Secondary | ICD-10-CM | POA: Diagnosis not present

## 2019-02-23 DIAGNOSIS — E7849 Other hyperlipidemia: Secondary | ICD-10-CM | POA: Diagnosis not present

## 2019-02-23 DIAGNOSIS — I10 Essential (primary) hypertension: Secondary | ICD-10-CM | POA: Diagnosis not present

## 2019-02-23 DIAGNOSIS — E876 Hypokalemia: Secondary | ICD-10-CM | POA: Diagnosis not present

## 2019-03-30 DIAGNOSIS — E7849 Other hyperlipidemia: Secondary | ICD-10-CM | POA: Diagnosis not present

## 2019-03-30 DIAGNOSIS — E782 Mixed hyperlipidemia: Secondary | ICD-10-CM | POA: Diagnosis not present

## 2019-03-30 DIAGNOSIS — I10 Essential (primary) hypertension: Secondary | ICD-10-CM | POA: Diagnosis not present

## 2019-03-30 DIAGNOSIS — E876 Hypokalemia: Secondary | ICD-10-CM | POA: Diagnosis not present

## 2019-06-01 DIAGNOSIS — M25551 Pain in right hip: Secondary | ICD-10-CM | POA: Diagnosis not present

## 2019-06-01 DIAGNOSIS — E870 Hyperosmolality and hypernatremia: Secondary | ICD-10-CM | POA: Diagnosis not present

## 2019-06-01 DIAGNOSIS — I10 Essential (primary) hypertension: Secondary | ICD-10-CM | POA: Diagnosis not present

## 2019-06-01 DIAGNOSIS — E7849 Other hyperlipidemia: Secondary | ICD-10-CM | POA: Diagnosis not present

## 2019-06-01 DIAGNOSIS — E559 Vitamin D deficiency, unspecified: Secondary | ICD-10-CM | POA: Diagnosis not present

## 2019-06-01 DIAGNOSIS — Z0000 Encounter for general adult medical examination without abnormal findings: Secondary | ICD-10-CM | POA: Diagnosis not present

## 2019-06-01 DIAGNOSIS — E782 Mixed hyperlipidemia: Secondary | ICD-10-CM | POA: Diagnosis not present

## 2019-06-01 DIAGNOSIS — E876 Hypokalemia: Secondary | ICD-10-CM | POA: Diagnosis not present

## 2019-06-01 DIAGNOSIS — L03317 Cellulitis of buttock: Secondary | ICD-10-CM | POA: Diagnosis not present

## 2019-06-06 ENCOUNTER — Other Ambulatory Visit (HOSPITAL_COMMUNITY): Payer: Self-pay | Admitting: Internal Medicine

## 2019-06-06 DIAGNOSIS — R7303 Prediabetes: Secondary | ICD-10-CM | POA: Diagnosis not present

## 2019-06-06 DIAGNOSIS — I10 Essential (primary) hypertension: Secondary | ICD-10-CM | POA: Diagnosis not present

## 2019-06-06 DIAGNOSIS — E663 Overweight: Secondary | ICD-10-CM | POA: Diagnosis not present

## 2019-06-06 DIAGNOSIS — Z0001 Encounter for general adult medical examination with abnormal findings: Secondary | ICD-10-CM | POA: Diagnosis not present

## 2019-06-06 DIAGNOSIS — Z136 Encounter for screening for cardiovascular disorders: Secondary | ICD-10-CM | POA: Diagnosis not present

## 2019-06-06 DIAGNOSIS — Z6828 Body mass index (BMI) 28.0-28.9, adult: Secondary | ICD-10-CM | POA: Diagnosis not present

## 2019-06-06 DIAGNOSIS — E559 Vitamin D deficiency, unspecified: Secondary | ICD-10-CM | POA: Diagnosis not present

## 2019-06-06 DIAGNOSIS — E782 Mixed hyperlipidemia: Secondary | ICD-10-CM | POA: Diagnosis not present

## 2019-06-06 DIAGNOSIS — M810 Age-related osteoporosis without current pathological fracture: Secondary | ICD-10-CM

## 2019-06-06 DIAGNOSIS — M205X9 Other deformities of toe(s) (acquired), unspecified foot: Secondary | ICD-10-CM | POA: Diagnosis not present

## 2019-06-06 DIAGNOSIS — E870 Hyperosmolality and hypernatremia: Secondary | ICD-10-CM | POA: Diagnosis not present

## 2019-06-20 DIAGNOSIS — I10 Essential (primary) hypertension: Secondary | ICD-10-CM | POA: Diagnosis not present

## 2019-08-22 DIAGNOSIS — I10 Essential (primary) hypertension: Secondary | ICD-10-CM | POA: Diagnosis not present

## 2019-08-22 DIAGNOSIS — E876 Hypokalemia: Secondary | ICD-10-CM | POA: Diagnosis not present

## 2019-08-22 DIAGNOSIS — E7849 Other hyperlipidemia: Secondary | ICD-10-CM | POA: Diagnosis not present

## 2019-08-22 DIAGNOSIS — E782 Mixed hyperlipidemia: Secondary | ICD-10-CM | POA: Diagnosis not present

## 2019-10-05 DIAGNOSIS — E7849 Other hyperlipidemia: Secondary | ICD-10-CM | POA: Diagnosis not present

## 2019-10-05 DIAGNOSIS — E876 Hypokalemia: Secondary | ICD-10-CM | POA: Diagnosis not present

## 2019-10-05 DIAGNOSIS — I10 Essential (primary) hypertension: Secondary | ICD-10-CM | POA: Diagnosis not present

## 2019-10-05 DIAGNOSIS — E782 Mixed hyperlipidemia: Secondary | ICD-10-CM | POA: Diagnosis not present

## 2019-10-27 DIAGNOSIS — E782 Mixed hyperlipidemia: Secondary | ICD-10-CM | POA: Diagnosis not present

## 2019-10-27 DIAGNOSIS — I10 Essential (primary) hypertension: Secondary | ICD-10-CM | POA: Diagnosis not present

## 2019-10-27 DIAGNOSIS — E7849 Other hyperlipidemia: Secondary | ICD-10-CM | POA: Diagnosis not present

## 2019-10-27 DIAGNOSIS — E876 Hypokalemia: Secondary | ICD-10-CM | POA: Diagnosis not present

## 2019-11-27 DIAGNOSIS — Z1231 Encounter for screening mammogram for malignant neoplasm of breast: Secondary | ICD-10-CM | POA: Diagnosis not present

## 2019-12-04 DIAGNOSIS — H04123 Dry eye syndrome of bilateral lacrimal glands: Secondary | ICD-10-CM | POA: Diagnosis not present

## 2019-12-04 DIAGNOSIS — H1045 Other chronic allergic conjunctivitis: Secondary | ICD-10-CM | POA: Diagnosis not present

## 2019-12-04 DIAGNOSIS — H25813 Combined forms of age-related cataract, bilateral: Secondary | ICD-10-CM | POA: Diagnosis not present

## 2019-12-05 DIAGNOSIS — E559 Vitamin D deficiency, unspecified: Secondary | ICD-10-CM | POA: Diagnosis not present

## 2019-12-05 DIAGNOSIS — Z0000 Encounter for general adult medical examination without abnormal findings: Secondary | ICD-10-CM | POA: Diagnosis not present

## 2019-12-05 DIAGNOSIS — Z136 Encounter for screening for cardiovascular disorders: Secondary | ICD-10-CM | POA: Diagnosis not present

## 2019-12-05 DIAGNOSIS — E663 Overweight: Secondary | ICD-10-CM | POA: Diagnosis not present

## 2019-12-05 DIAGNOSIS — M205X9 Other deformities of toe(s) (acquired), unspecified foot: Secondary | ICD-10-CM | POA: Diagnosis not present

## 2019-12-05 DIAGNOSIS — R7303 Prediabetes: Secondary | ICD-10-CM | POA: Diagnosis not present

## 2019-12-05 DIAGNOSIS — Z0001 Encounter for general adult medical examination with abnormal findings: Secondary | ICD-10-CM | POA: Diagnosis not present

## 2019-12-05 DIAGNOSIS — Z6828 Body mass index (BMI) 28.0-28.9, adult: Secondary | ICD-10-CM | POA: Diagnosis not present

## 2019-12-05 DIAGNOSIS — I10 Essential (primary) hypertension: Secondary | ICD-10-CM | POA: Diagnosis not present

## 2019-12-05 DIAGNOSIS — E876 Hypokalemia: Secondary | ICD-10-CM | POA: Diagnosis not present

## 2019-12-12 DIAGNOSIS — E870 Hyperosmolality and hypernatremia: Secondary | ICD-10-CM | POA: Diagnosis not present

## 2019-12-12 DIAGNOSIS — Z23 Encounter for immunization: Secondary | ICD-10-CM | POA: Diagnosis not present

## 2019-12-12 DIAGNOSIS — R7303 Prediabetes: Secondary | ICD-10-CM | POA: Diagnosis not present

## 2019-12-12 DIAGNOSIS — Z6829 Body mass index (BMI) 29.0-29.9, adult: Secondary | ICD-10-CM | POA: Diagnosis not present

## 2019-12-12 DIAGNOSIS — M205X9 Other deformities of toe(s) (acquired), unspecified foot: Secondary | ICD-10-CM | POA: Diagnosis not present

## 2019-12-12 DIAGNOSIS — E782 Mixed hyperlipidemia: Secondary | ICD-10-CM | POA: Diagnosis not present

## 2019-12-12 DIAGNOSIS — E559 Vitamin D deficiency, unspecified: Secondary | ICD-10-CM | POA: Diagnosis not present

## 2019-12-12 DIAGNOSIS — I10 Essential (primary) hypertension: Secondary | ICD-10-CM | POA: Diagnosis not present

## 2019-12-12 DIAGNOSIS — E663 Overweight: Secondary | ICD-10-CM | POA: Diagnosis not present

## 2019-12-13 DIAGNOSIS — I10 Essential (primary) hypertension: Secondary | ICD-10-CM | POA: Diagnosis not present

## 2019-12-13 DIAGNOSIS — E782 Mixed hyperlipidemia: Secondary | ICD-10-CM | POA: Diagnosis not present

## 2019-12-18 DIAGNOSIS — R928 Other abnormal and inconclusive findings on diagnostic imaging of breast: Secondary | ICD-10-CM | POA: Diagnosis not present

## 2019-12-18 DIAGNOSIS — R921 Mammographic calcification found on diagnostic imaging of breast: Secondary | ICD-10-CM | POA: Diagnosis not present

## 2019-12-18 DIAGNOSIS — R922 Inconclusive mammogram: Secondary | ICD-10-CM | POA: Diagnosis not present

## 2020-01-02 DIAGNOSIS — I10 Essential (primary) hypertension: Secondary | ICD-10-CM | POA: Diagnosis not present

## 2020-01-04 DIAGNOSIS — R921 Mammographic calcification found on diagnostic imaging of breast: Secondary | ICD-10-CM | POA: Diagnosis not present

## 2020-01-04 DIAGNOSIS — N6092 Unspecified benign mammary dysplasia of left breast: Secondary | ICD-10-CM | POA: Diagnosis not present

## 2020-01-19 DIAGNOSIS — E782 Mixed hyperlipidemia: Secondary | ICD-10-CM | POA: Diagnosis not present

## 2020-01-19 DIAGNOSIS — I10 Essential (primary) hypertension: Secondary | ICD-10-CM | POA: Diagnosis not present

## 2020-02-06 ENCOUNTER — Ambulatory Visit: Payer: Self-pay | Admitting: Surgery

## 2020-02-06 DIAGNOSIS — N6092 Unspecified benign mammary dysplasia of left breast: Secondary | ICD-10-CM | POA: Diagnosis not present

## 2020-02-06 NOTE — H&P (View-Only) (Signed)
History of Present Illness Erika Baker. Mauri Temkin MD; 02/06/2020 2:51 PM) The patient is a 70 year old female who presents with a breast mass. PCP - Dr. Delphina Cahill Referred by for left breast ADH  This is a 70 year old female with a history of hypertension but no family history of breast cancer or breast problems who presents with a finding on a recent routine screening mammogram. In the left breast at 9:00 middle depth 6.5 cm from the nipple she was found to have a 0.5 cm group of calcifications. Subsequent imaging showed a 7 mm area that was subsequently biopsied under stereotactic guidance. Pathology showed atypical ductal hyperplasia with calcifications. She is referred to [place] for surgical evaluation.  The patient denies any previous breast surgery or breast problems. No complaints prior to her mammogram. No family history of breast cancer. She has had a hysterectomy but retains her ovaries.   Past Surgical History [name], LPN; 1/61/0960 4:54 PM) Colon Polyp Removal - Colonoscopy Hysterectomy (not due to cancer) - Partial  Diagnostic Studies History [name], LPN; 0/98/1191 4:78 PM) Colonoscopy >10 years ago Mammogram within last year Pap Smear >5 years ago  Allergies [name], LPN; 2/95/6213 0:86 PM) No Known Drug Allergies [02/06/2020]: Allergies Reconciled  Medication History [name], LPN; 5/78/4696 2:95 PM) amLODIPine Besylate (5MG  Tablet, Oral) Active. Atorvastatin Calcium (40MG  Tablet, Oral) Active. Losartan Potassium (100MG  Tablet, Oral) Active. Vitamin D3 (125 MCG(5000 UT) Capsule, Oral) Active. Medications Reconciled  Social History [name], LPN; 2/84/1324 4:01 PM) Caffeine use Carbonated beverages, Tea. No alcohol use No drug use Tobacco use Never smoker.  Family History [name], LPN; 0/27/2536 6:44 PM) Hypertension Mother, Sister. Kidney Disease Family Members In General.  Pregnancy / Birth History [name], LPN; 0/34/7425 9:56 PM) Age of menopause <43 Gravida 0  Other Problems [name], LPN; 3/87/5643 3:29 PM) Asthma High blood pressure Hypercholesterolemia     Review of Systems Claiborne Billings Dockery LPN; 06/06/8414 6:06 PM) General Not Present- Appetite Loss, Chills, Fatigue, Fever, Night Sweats, Weight Gain and Weight Loss. Skin Not Present- Change in Wart/Mole, Dryness, Hives, Jaundice, New Lesions, Non-Healing Wounds, Rash and Ulcer. HEENT Not Present- Earache, Hearing Loss, Hoarseness, Nose Bleed, Oral Ulcers, Ringing in the Ears, Seasonal Allergies, Sinus Pain, Sore Throat, Visual Disturbances, Wears glasses/contact lenses and Yellow Eyes. Respiratory Not Present- Bloody sputum, Chronic Cough, Difficulty Breathing, Snoring and Wheezing. Breast Not Present- Breast Mass, Breast Pain, Nipple Discharge and Skin Changes. Cardiovascular Not Present- Chest Pain, Difficulty Breathing Lying Down, Leg Cramps, Palpitations, Rapid Heart Rate, Shortness of Breath and Swelling of Extremities. Gastrointestinal Not Present- Abdominal Pain, Bloating, Bloody Stool, Change in Bowel Habits, Chronic diarrhea, Constipation, Difficulty Swallowing, Excessive gas, Gets full quickly at meals, Hemorrhoids, Indigestion, Nausea, Rectal Pain and Vomiting. Female Genitourinary Not Present- Frequency, Nocturia, Painful Urination, Pelvic Pain and Urgency. Musculoskeletal Not Present- Back Pain, Joint Pain, Joint Stiffness, Muscle Pain, Muscle Weakness and Swelling of Extremities. Neurological Not Present- Decreased Memory, Fainting, Headaches, Numbness, Seizures, Tingling, Tremor, Trouble walking and Weakness. Psychiatric Not Present- Anxiety, Bipolar, Change in Sleep Pattern, Depression, Fearful and Frequent crying. Endocrine Not Present- Cold Intolerance, Excessive Hunger, Hair Changes, Heat Intolerance, Hot flashes and New Diabetes. Hematology Not Present- Blood Thinners, Easy Bruising, Excessive bleeding,  Gland problems, HIV and Persistent Infections.  Vitals Claiborne Billings Dockery LPN; 03/19/6008 9:32 PM) 02/06/2020 2:43 PM Weight: 171.4 lb Height: 62in Body Surface Area: 1.79 m Body Mass Index: 31.35 kg/m  Pulse: 114 (Regular)  BP: 142/80(Sitting, Left Arm,  Standard)        Physical Exam Rodman Key K. Stela Iwasaki MD; 02/06/2020 2:51 PM)  The physical exam findings are as follows: Note:Constitutional: WDWN in NAD, conversant, no obvious deformities; resting comfortably Eyes: Pupils equal, round; sclera anicteric; moist conjunctiva; no lid lag HENT: Oral mucosa moist; good dentition Neck: No masses palpated, trachea midline; no thyromegaly Lungs: CTA bilaterally; normal respiratory effort Breasts: symmetric, no nipple changes; no palpable masses; no axillary lymphadenopathy CV: Regular rate and rhythm; no murmurs; extremities well-perfused with no edema Abd: +bowel sounds, soft, non-tender, no palpable organomegaly; no palpable hernias Musc: Normal gait; no apparent clubbing or cyanosis in extremities Lymphatic: No palpable cervical or axillary lymphadenopathy Skin: Warm, dry; no sign of jaundice Psychiatric - alert and oriented x 4; calm mood and affect    Assessment & Plan Rodman Key K. Zuha Dejonge MD; 02/06/2020 2:58 PM)  ATYPICAL DUCTAL HYPERPLASIA OF LEFT BREAST (N60.92)  Current Plans Schedule for Surgery - Left radioactive seed localized lumpectomy. The surgical procedure has been discussed with the patient. Potential risks, benefits, alternative treatments, and expected outcomes have been explained. All of the patient's questions at this time have been answered. The likelihood of reaching the patient's treatment goal is good. The patient understand the proposed surgical procedure and wishes to proceed.  Erika Baker. Georgette Dover, MD, [facility]  General/ Trauma Surgery   02/06/2020 2:59 PM

## 2020-02-06 NOTE — H&P (Signed)
History of Present Illness Erika Baker. Erika Sadek MD; 02/06/2020 2:51 PM) The patient is a 70 year old female who presents with a breast mass. PCP - Dr. Delphina Cahill Referred by for left breast ADH  This is a 70 year old female with a history of hypertension but no family history of breast cancer or breast problems who presents with a finding on a recent routine screening mammogram. In the left breast at 9:00 middle depth 6.5 cm from the nipple she was found to have a 0.5 cm group of calcifications. Subsequent imaging showed a 7 mm area that was subsequently biopsied under stereotactic guidance. Pathology showed atypical ductal hyperplasia with calcifications. She is referred to [place] for surgical evaluation.  The patient denies any previous breast surgery or breast problems. No complaints prior to her mammogram. No family history of breast cancer. She has had a hysterectomy but retains her ovaries.   Past Surgical History Erika Lorenzo, LPN; 1/61/0960 4:54 PM) Colon Polyp Removal - Colonoscopy Hysterectomy (not due to cancer) - Partial  Diagnostic Studies History Erika Lorenzo, LPN; 0/98/1191 4:78 PM) Colonoscopy >10 years ago Mammogram within last year Pap Smear >5 years ago  Allergies Erika Lorenzo, LPN; 2/95/6213 0:86 PM) No Known Drug Allergies [02/06/2020]: Allergies Reconciled  Medication History Erika Lorenzo, LPN; 5/78/4696 2:95 PM) amLODIPine Besylate (5MG  Tablet, Oral) Active. Atorvastatin Calcium (40MG  Tablet, Oral) Active. Losartan Potassium (100MG  Tablet, Oral) Active. Vitamin D3 (125 MCG(5000 UT) Capsule, Oral) Active. Medications Reconciled  Social History Erika Lorenzo, LPN; 2/84/1324 4:01 PM) Caffeine use Carbonated beverages, Tea. No alcohol use No drug use Tobacco use Never smoker.  Family History Erika Lorenzo, LPN; 0/27/2536 6:44 PM) Hypertension Mother, Sister. Kidney Disease Family Members In General.  Pregnancy / Birth History Erika Lorenzo, LPN; 0/34/7425 9:56 PM) Age of menopause <43 Gravida 0  Other Problems Erika Lorenzo, LPN; 3/87/5643 3:29 PM) Asthma High blood pressure Hypercholesterolemia     Review of Systems Claiborne Billings Dockery LPN; 06/06/8414 6:06 PM) General Not Present- Appetite Loss, Chills, Fatigue, Fever, Night Sweats, Weight Gain and Weight Loss. Skin Not Present- Change in Wart/Mole, Dryness, Hives, Jaundice, New Lesions, Non-Healing Wounds, Rash and Ulcer. HEENT Not Present- Earache, Hearing Loss, Hoarseness, Nose Bleed, Oral Ulcers, Ringing in the Ears, Seasonal Allergies, Sinus Pain, Sore Throat, Visual Disturbances, Wears glasses/contact lenses and Yellow Eyes. Respiratory Not Present- Bloody sputum, Chronic Cough, Difficulty Breathing, Snoring and Wheezing. Breast Not Present- Breast Mass, Breast Pain, Nipple Discharge and Skin Changes. Cardiovascular Not Present- Chest Pain, Difficulty Breathing Lying Down, Leg Cramps, Palpitations, Rapid Heart Rate, Shortness of Breath and Swelling of Extremities. Gastrointestinal Not Present- Abdominal Pain, Bloating, Bloody Stool, Change in Bowel Habits, Chronic diarrhea, Constipation, Difficulty Swallowing, Excessive gas, Gets full quickly at meals, Hemorrhoids, Indigestion, Nausea, Rectal Pain and Vomiting. Female Genitourinary Not Present- Frequency, Nocturia, Painful Urination, Pelvic Pain and Urgency. Musculoskeletal Not Present- Back Pain, Joint Pain, Joint Stiffness, Muscle Pain, Muscle Weakness and Swelling of Extremities. Neurological Not Present- Decreased Memory, Fainting, Headaches, Numbness, Seizures, Tingling, Tremor, Trouble walking and Weakness. Psychiatric Not Present- Anxiety, Bipolar, Change in Sleep Pattern, Depression, Fearful and Frequent crying. Endocrine Not Present- Cold Intolerance, Excessive Hunger, Hair Changes, Heat Intolerance, Hot flashes and New Diabetes. Hematology Not Present- Blood Thinners, Easy Bruising, Excessive bleeding,  Gland problems, HIV and Persistent Infections.  Vitals Claiborne Billings Dockery LPN; 03/19/6008 9:32 PM) 02/06/2020 2:43 PM Weight: 171.4 lb Height: 62in Body Surface Area: 1.79 m Body Mass Index: 31.35 kg/m  Pulse: 114 (Regular)  BP: 142/80(Sitting, Left Arm,  Standard)        Physical Exam (Haydyn Girvan K. Dontray Haberland MD; 02/06/2020 2:51 PM)  The physical exam findings are as follows: Note:Constitutional: WDWN in NAD, conversant, no obvious deformities; resting comfortably Eyes: Pupils equal, round; sclera anicteric; moist conjunctiva; no lid lag HENT: Oral mucosa moist; good dentition Neck: No masses palpated, trachea midline; no thyromegaly Lungs: CTA bilaterally; normal respiratory effort Breasts: symmetric, no nipple changes; no palpable masses; no axillary lymphadenopathy CV: Regular rate and rhythm; no murmurs; extremities well-perfused with no edema Abd: +bowel sounds, soft, non-tender, no palpable organomegaly; no palpable hernias Musc: Normal gait; no apparent clubbing or cyanosis in extremities Lymphatic: No palpable cervical or axillary lymphadenopathy Skin: Warm, dry; no sign of jaundice Psychiatric - alert and oriented x 4; calm mood and affect    Assessment & Plan (Freya Zobrist K. Kadar Chance MD; 02/06/2020 2:58 PM)  ATYPICAL DUCTAL HYPERPLASIA OF LEFT BREAST (N60.92)  Current Plans Schedule for Surgery - Left radioactive seed localized lumpectomy. The surgical procedure has been discussed with the patient. Potential risks, benefits, alternative treatments, and expected outcomes have been explained. All of the patient's questions at this time have been answered. The likelihood of reaching the patient's treatment goal is good. The patient understand the proposed surgical procedure and wishes to proceed.  Ferrel Simington K. Avryl Roehm, MD, FACS [facility]  General/ Trauma Surgery   02/06/2020 2:59 PM   

## 2020-02-14 ENCOUNTER — Encounter (HOSPITAL_BASED_OUTPATIENT_CLINIC_OR_DEPARTMENT_OTHER): Payer: Self-pay | Admitting: Surgery

## 2020-02-14 ENCOUNTER — Other Ambulatory Visit: Payer: Self-pay

## 2020-02-16 ENCOUNTER — Other Ambulatory Visit (HOSPITAL_COMMUNITY)
Admission: RE | Admit: 2020-02-16 | Discharge: 2020-02-16 | Disposition: A | Discharge status: Home / Self Care | Payer: Medicare HMO | Source: Ambulatory Visit | Attending: Surgery | Admitting: Surgery

## 2020-02-16 ENCOUNTER — Encounter (HOSPITAL_BASED_OUTPATIENT_CLINIC_OR_DEPARTMENT_OTHER)
Admission: RE | Admit: 2020-02-16 | Discharge: 2020-02-16 | Disposition: A | Discharge status: Home / Self Care | Payer: Medicare HMO | Source: Ambulatory Visit | Attending: Surgery | Admitting: Surgery

## 2020-02-16 DIAGNOSIS — I10 Essential (primary) hypertension: Secondary | ICD-10-CM | POA: Insufficient documentation

## 2020-02-16 DIAGNOSIS — Z20822 Contact with and (suspected) exposure to covid-19: Secondary | ICD-10-CM | POA: Insufficient documentation

## 2020-02-16 DIAGNOSIS — Z01812 Encounter for preprocedural laboratory examination: Secondary | ICD-10-CM | POA: Insufficient documentation

## 2020-02-16 DIAGNOSIS — Z01818 Encounter for other preprocedural examination: Secondary | ICD-10-CM | POA: Insufficient documentation

## 2020-02-16 LAB — SARS CORONAVIRUS 2 (TAT 6-24 HRS): SARS Coronavirus 2: NEGATIVE

## 2020-02-16 NOTE — Progress Notes (Signed)

## 2020-02-19 DIAGNOSIS — R921 Mammographic calcification found on diagnostic imaging of breast: Secondary | ICD-10-CM | POA: Diagnosis not present

## 2020-02-20 ENCOUNTER — Ambulatory Visit (HOSPITAL_BASED_OUTPATIENT_CLINIC_OR_DEPARTMENT_OTHER)
Admission: RE | Admit: 2020-02-20 | Discharge: 2020-02-20 | Disposition: A | Discharge status: Home / Self Care | Payer: Medicare HMO | Attending: Surgery | Admitting: Surgery

## 2020-02-20 ENCOUNTER — Other Ambulatory Visit: Payer: Self-pay

## 2020-02-20 ENCOUNTER — Encounter (HOSPITAL_BASED_OUTPATIENT_CLINIC_OR_DEPARTMENT_OTHER): Payer: Self-pay | Admitting: Surgery

## 2020-02-20 ENCOUNTER — Ambulatory Visit (HOSPITAL_BASED_OUTPATIENT_CLINIC_OR_DEPARTMENT_OTHER): Payer: Medicare HMO | Admitting: Anesthesiology

## 2020-02-20 ENCOUNTER — Encounter (HOSPITAL_BASED_OUTPATIENT_CLINIC_OR_DEPARTMENT_OTHER)
Admission: RE | Disposition: A | Discharge status: Home / Self Care | Payer: Self-pay | Source: Home / Self Care | Attending: Surgery

## 2020-02-20 DIAGNOSIS — N6092 Unspecified benign mammary dysplasia of left breast: Secondary | ICD-10-CM | POA: Insufficient documentation

## 2020-02-20 DIAGNOSIS — I10 Essential (primary) hypertension: Secondary | ICD-10-CM | POA: Diagnosis not present

## 2020-02-20 DIAGNOSIS — E785 Hyperlipidemia, unspecified: Secondary | ICD-10-CM | POA: Diagnosis not present

## 2020-02-20 DIAGNOSIS — D4862 Neoplasm of uncertain behavior of left breast: Secondary | ICD-10-CM | POA: Diagnosis not present

## 2020-02-20 DIAGNOSIS — N6082 Other benign mammary dysplasias of left breast: Secondary | ICD-10-CM | POA: Diagnosis not present

## 2020-02-20 DIAGNOSIS — J45909 Unspecified asthma, uncomplicated: Secondary | ICD-10-CM | POA: Diagnosis not present

## 2020-02-20 HISTORY — PX: BREAST LUMPECTOMY WITH RADIOACTIVE SEED LOCALIZATION: SHX6424

## 2020-02-20 HISTORY — DX: Unspecified benign mammary dysplasia of left breast: N60.92

## 2020-02-20 HISTORY — DX: Hyperlipidemia, unspecified: E78.5

## 2020-02-20 SURGERY — BREAST LUMPECTOMY WITH RADIOACTIVE SEED LOCALIZATION
Anesthesia: General | Site: Breast | Laterality: Left

## 2020-02-20 MED ORDER — ACETAMINOPHEN 500 MG PO TABS
ORAL_TABLET | ORAL | Status: AC
  Filled 2020-02-20: qty 2

## 2020-02-20 MED ORDER — LACTATED RINGERS IV SOLN: INTRAVENOUS | Status: DC

## 2020-02-20 MED ORDER — LIDOCAINE HCL (PF) 2 % IJ SOLN
INTRAMUSCULAR | Status: DC | PRN
  Administered 2020-02-20: 80 mg via INTRADERMAL

## 2020-02-20 MED ORDER — FENTANYL CITRATE (PF) 100 MCG/2ML IJ SOLN
INTRAMUSCULAR | Status: AC
  Filled 2020-02-20: qty 2

## 2020-02-20 MED ORDER — BUPIVACAINE-EPINEPHRINE 0.25% -1:200000 IJ SOLN
INTRAMUSCULAR | Status: DC | PRN
  Administered 2020-02-20: 10 mL

## 2020-02-20 MED ORDER — DEXAMETHASONE SODIUM PHOSPHATE 10 MG/ML IJ SOLN
INTRAMUSCULAR | Status: AC
  Filled 2020-02-20: qty 1

## 2020-02-20 MED ORDER — PROPOFOL 500 MG/50ML IV EMUL
INTRAVENOUS | Status: DC | PRN
  Administered 2020-02-20: 25 ug/kg/min via INTRAVENOUS

## 2020-02-20 MED ORDER — ONDANSETRON HCL 4 MG/2ML IJ SOLN
INTRAMUSCULAR | Status: DC | PRN
Start: 2020-02-20 — End: 2020-02-20
  Administered 2020-02-20: 4 mg via INTRAVENOUS

## 2020-02-20 MED ORDER — PROPOFOL 10 MG/ML IV BOLUS
INTRAVENOUS | Status: AC
  Filled 2020-02-20: qty 20

## 2020-02-20 MED ORDER — EPHEDRINE 5 MG/ML INJ
INTRAVENOUS | Status: AC
  Filled 2020-02-20: qty 10

## 2020-02-20 MED ORDER — ONDANSETRON HCL 4 MG/2ML IJ SOLN
INTRAMUSCULAR | Status: AC
  Filled 2020-02-20: qty 2

## 2020-02-20 MED ORDER — CEFAZOLIN SODIUM-DEXTROSE 2-4 GM/100ML-% IV SOLN
INTRAVENOUS | Status: AC
  Filled 2020-02-20: qty 100

## 2020-02-20 MED ORDER — CHLORHEXIDINE GLUCONATE CLOTH 2 % EX PADS: 6.0000 | MEDICATED_PAD | Freq: Once | CUTANEOUS | Status: DC

## 2020-02-20 MED ORDER — PROPOFOL 10 MG/ML IV BOLUS
INTRAVENOUS | Status: DC | PRN
  Administered 2020-02-20: 120 mg via INTRAVENOUS

## 2020-02-20 MED ORDER — CEFAZOLIN SODIUM-DEXTROSE 2-4 GM/100ML-% IV SOLN
2.0000 g | INTRAVENOUS | Status: AC
  Administered 2020-02-20: 2 g via INTRAVENOUS

## 2020-02-20 MED ORDER — DEXAMETHASONE SODIUM PHOSPHATE 10 MG/ML IJ SOLN
INTRAMUSCULAR | Status: DC | PRN
  Administered 2020-02-20: 8 mg via INTRAVENOUS

## 2020-02-20 MED ORDER — LIDOCAINE 2% (20 MG/ML) 5 ML SYRINGE
INTRAMUSCULAR | Status: AC
  Filled 2020-02-20: qty 5

## 2020-02-20 MED ORDER — GABAPENTIN 300 MG PO CAPS
ORAL_CAPSULE | ORAL | Status: AC
  Filled 2020-02-20: qty 1

## 2020-02-20 MED ORDER — FENTANYL CITRATE (PF) 100 MCG/2ML IJ SOLN: 25.0000 ug | INTRAMUSCULAR | Status: DC | PRN

## 2020-02-20 MED ORDER — OXYCODONE HCL 5 MG PO TABS: 5.0000 mg | ORAL_TABLET | Freq: Once | ORAL | Status: DC | PRN

## 2020-02-20 MED ORDER — GABAPENTIN 300 MG PO CAPS
300.0000 mg | ORAL_CAPSULE | ORAL | Status: AC
  Administered 2020-02-20: 300 mg via ORAL

## 2020-02-20 MED ORDER — OXYCODONE HCL 5 MG/5ML PO SOLN
5.0000 mg | Freq: Once | ORAL | Status: DC | PRN
Start: 2020-02-20 — End: 2020-02-20

## 2020-02-20 MED ORDER — EPHEDRINE SULFATE 50 MG/ML IJ SOLN
INTRAMUSCULAR | Status: DC | PRN
  Administered 2020-02-20 (×5): 10 mg via INTRAVENOUS

## 2020-02-20 MED ORDER — FENTANYL CITRATE (PF) 100 MCG/2ML IJ SOLN
INTRAMUSCULAR | Status: DC | PRN
  Administered 2020-02-20: 25 ug via INTRAVENOUS

## 2020-02-20 MED ORDER — ACETAMINOPHEN 500 MG PO TABS
1000.0000 mg | ORAL_TABLET | ORAL | Status: AC
  Administered 2020-02-20: 1000 mg via ORAL

## 2020-02-20 SURGICAL SUPPLY — 50 items
APL PRP STRL LF DISP 70% ISPRP (MISCELLANEOUS) ×1
APL SKNCLS STERI-STRIP NONHPOA (GAUZE/BANDAGES/DRESSINGS) ×1
APPLIER CLIP 9.375 MED OPEN (MISCELLANEOUS)
APR CLP MED 9.3 20 MLT OPN (MISCELLANEOUS)
BENZOIN TINCTURE PRP APPL 2/3 (GAUZE/BANDAGES/DRESSINGS) ×2 IMPLANT
BLADE HEX COATED 2.75 (ELECTRODE) ×2 IMPLANT
BLADE SURG 15 STRL LF DISP TIS (BLADE) ×1 IMPLANT
BLADE SURG 15 STRL SS (BLADE) ×2
CANISTER SUC SOCK COL 7IN (MISCELLANEOUS) IMPLANT
CANISTER SUCT 1200ML W/VALVE (MISCELLANEOUS) IMPLANT
CHLORAPREP W/TINT 26 (MISCELLANEOUS) ×2 IMPLANT
CLIP APPLIE 9.375 MED OPEN (MISCELLANEOUS) ×1 IMPLANT
COVER BACK TABLE 60X90IN (DRAPES) ×2 IMPLANT
COVER MAYO STAND STRL (DRAPES) ×2 IMPLANT
COVER PROBE W GEL 5X96 (DRAPES) ×2 IMPLANT
COVER WAND RF STERILE (DRAPES) IMPLANT
DECANTER SPIKE VIAL GLASS SM (MISCELLANEOUS) ×1 IMPLANT
DRAPE LAPAROTOMY 100X72 PEDS (DRAPES) ×2 IMPLANT
DRAPE UTILITY XL STRL (DRAPES) ×2 IMPLANT
DRSG TEGADERM 4X4.75 (GAUZE/BANDAGES/DRESSINGS) ×2 IMPLANT
ELECT REM PT RETURN 9FT ADLT (ELECTROSURGICAL) ×2
ELECTRODE REM PT RTRN 9FT ADLT (ELECTROSURGICAL) ×1 IMPLANT
GAUZE SPONGE 4X4 12PLY STRL LF (GAUZE/BANDAGES/DRESSINGS) IMPLANT
GLOVE SURG ENC MOIS LTX SZ7 (GLOVE) ×2 IMPLANT
GLOVE SURG UNDER POLY LF SZ7.5 (GLOVE) ×2 IMPLANT
GOWN STRL REUS W/ TWL LRG LVL3 (GOWN DISPOSABLE) ×2 IMPLANT
GOWN STRL REUS W/TWL LRG LVL3 (GOWN DISPOSABLE) ×4
ILLUMINATOR WAVEGUIDE N/F (MISCELLANEOUS) IMPLANT
KIT MARKER MARGIN INK (KITS) ×2 IMPLANT
LIGHT WAVEGUIDE WIDE FLAT (MISCELLANEOUS) IMPLANT
NDL HYPO 25X1 1.5 SAFETY (NEEDLE) ×1 IMPLANT
NEEDLE HYPO 25X1 1.5 SAFETY (NEEDLE) ×2 IMPLANT
NS IRRIG 1000ML POUR BTL (IV SOLUTION) ×1 IMPLANT
PACK BASIN DAY SURGERY FS (CUSTOM PROCEDURE TRAY) ×2 IMPLANT
PENCIL SMOKE EVACUATOR (MISCELLANEOUS) ×2 IMPLANT
SLEEVE SCD COMPRESS KNEE MED (MISCELLANEOUS) ×2 IMPLANT
SPONGE GAUZE 2X2 8PLY STRL LF (GAUZE/BANDAGES/DRESSINGS) ×1 IMPLANT
SPONGE LAP 18X18 RF (DISPOSABLE) IMPLANT
SPONGE LAP 4X18 RFD (DISPOSABLE) ×2 IMPLANT
STRIP CLOSURE SKIN 1/2X4 (GAUZE/BANDAGES/DRESSINGS) ×2 IMPLANT
SUT MON AB 4-0 PC3 18 (SUTURE) ×2 IMPLANT
SUT SILK 2 0 SH (SUTURE) IMPLANT
SUT VIC AB 3-0 SH 27 (SUTURE) ×2
SUT VIC AB 3-0 SH 27X BRD (SUTURE) ×1 IMPLANT
SYR BULB EAR ULCER 3OZ GRN STR (SYRINGE) ×2 IMPLANT
SYR CONTROL 10ML LL (SYRINGE) ×2 IMPLANT
TOWEL GREEN STERILE FF (TOWEL DISPOSABLE) ×2 IMPLANT
TRAY FAXITRON CT DISP (TRAY / TRAY PROCEDURE) ×2 IMPLANT
TUBE CONNECTING 20X1/4 (TUBING) IMPLANT
YANKAUER SUCT BULB TIP NO VENT (SUCTIONS) IMPLANT

## 2020-02-20 NOTE — Anesthesia Preprocedure Evaluation (Addendum)
Anesthesia Evaluation  Patient identified by MRN, date of birth, ID band Patient awake    Reviewed: Allergy & Precautions, NPO status , Patient's Chart, lab work & pertinent test results  Airway Mallampati: II  TM Distance: >3 FB Neck ROM: Full    Dental  (+) Dental Advisory Given, Teeth Intact   Pulmonary asthma ,    Pulmonary exam normal breath sounds clear to auscultation       Cardiovascular hypertension, Pt. on medications Normal cardiovascular exam Rhythm:Regular Rate:Normal     Neuro/Psych negative neurological ROS     GI/Hepatic negative GI ROS, Neg liver ROS,   Endo/Other  negative endocrine ROS  Renal/GU negative Renal ROS     Musculoskeletal negative musculoskeletal ROS (+)   Abdominal   Peds  Hematology negative hematology ROS (+)   Anesthesia Other Findings   Reproductive/Obstetrics                            Anesthesia Physical Anesthesia Plan  ASA: II  Anesthesia Plan: General   Post-op Pain Management:    Induction: Intravenous  PONV Risk Score and Plan: 4 or greater and Ondansetron, Dexamethasone, Treatment may vary due to age or medical condition, Scopolamine patch - Pre-op and Propofol infusion  Airway Management Planned: LMA  Additional Equipment: None  Intra-op Plan:   Post-operative Plan: Extubation in OR  Informed Consent: I have reviewed the patients History and Physical, chart, labs and discussed the procedure including the risks, benefits and alternatives for the proposed anesthesia with the patient or authorized representative who has indicated his/her understanding and acceptance.     Dental advisory given  Plan Discussed with: CRNA  Anesthesia Plan Comments:        Anesthesia Quick Evaluation

## 2020-02-20 NOTE — Discharge Instructions (Signed)
[place] Office Phone Number [phone]  BREAST BIOPSY/ PARTIAL MASTECTOMY: POST OP INSTRUCTIONS  Always review your discharge instruction sheet given to you by the facility where your surgery was performed.  IF YOU HAVE DISABILITY OR FAMILY LEAVE FORMS, YOU MUST BRING THEM TO THE OFFICE FOR PROCESSING.  DO NOT GIVE THEM TO YOUR DOCTOR.  1. A prescription for pain medication may be given to you upon discharge.  Take your pain medication as prescribed, if needed.  If narcotic pain medicine is not needed, then you may take acetaminophen (Tylenol) or ibuprofen (Advil) as needed. 2. Take your usually prescribed medications unless otherwise directed 3. If you need a refill on your pain medication, please contact your pharmacy.  They will contact our office to request authorization.  Prescriptions will not be filled after 5pm or on week-ends. 4. You should eat very light the first 24 hours after surgery, such as soup, crackers, pudding, etc.  Resume your normal diet the day after surgery. 5. Most patients will experience some swelling and bruising in the breast.  Ice packs and a good support bra will help.  Swelling and bruising can take several days to resolve.  6. It is common to experience some constipation if taking pain medication after surgery.  Increasing fluid intake and taking a stool softener will usually help or prevent this problem from occurring.  A mild laxative (Milk of Magnesia or Miralax) should be taken according to package directions if there are no bowel movements after 48 hours. 7. Unless discharge instructions indicate otherwise, you may remove your bandages 24-48 hours after surgery, and you may shower at that time.  You may have steri-strips (small skin tapes) in place directly over the incision.  These strips should be left on the skin for 7-10 days.  If your surgeon used skin glue on the incision, you may shower in 24 hours.  The glue will flake off over the  next 2-3 weeks.  Any sutures or staples will be removed at the office during your follow-up visit. 8. ACTIVITIES:  You may resume regular daily activities (gradually increasing) beginning the next day.  Wearing a good support bra or sports bra minimizes pain and swelling.  You may have sexual intercourse when it is comfortable. a. You may drive when you no longer are taking prescription pain medication, you can comfortably wear a seatbelt, and you can safely maneuver your car and apply brakes. b. RETURN TO WORK:  ______________________________________________________________________________________ 9. You should see your doctor in the office for a follow-up appointment approximately two weeks after your surgery.  Your doctor's nurse will typically make your follow-up appointment when she calls you with your pathology report.  Expect your pathology report 2-3 business days after your surgery.  You may call to check if you do not hear from [place] after three days. 10. OTHER INSTRUCTIONS: _______________________________________________________________________________________________ _____________________________________________________________________________________________________________________________________ _____________________________________________________________________________________________________________________________________ _____________________________________________________________________________________________________________________________________  WHEN TO CALL YOUR DOCTOR: 1. Fever over 101.0 2. Nausea and/or vomiting. 3. Extreme swelling or bruising. 4. Continued bleeding from incision. 5. Increased pain, redness, or drainage from the incision.  The clinic staff is available to answer your questions during regular business hours.  Please don't hesitate to call and ask to speak to one of the nurses for clinical concerns.  If you have a medical emergency, go to the nearest  emergency room or call 911.  A surgeon from [facility] is always on call at the hospital.  For further questions, please visit [email or web address]  No Tylenol until after 4pm today if needed  Post Anesthesia Home Care Instructions  Activity: Get plenty of rest for the remainder of the day. A responsible individual must stay with you for 24 hours following the procedure.  For the next 24 hours, DO NOT: -Drive a car -Paediatric nurse -Drink alcoholic beverages -Take any medication unless instructed by your physician -Make any legal decisions or sign important papers.  Meals: Start with liquid foods such as gelatin or soup. Progress to regular foods as tolerated. Avoid greasy, spicy, heavy foods. If nausea and/or vomiting occur, drink only clear liquids until the nausea and/or vomiting subsides. Call your physician if vomiting continues.  Special Instructions/Symptoms: Your throat may feel dry or sore from the anesthesia or the breathing tube placed in your throat during surgery. If this causes discomfort, gargle with warm salt water. The discomfort should disappear within 24 hours.  If you had a scopolamine patch placed behind your ear for the management of post- operative nausea and/or vomiting:  1. The medication in the patch is effective for 72 hours, after which it should be removed.  Wrap patch in a tissue and discard in the trash. Wash hands thoroughly with soap and water. 2. You may remove the patch earlier than 72 hours if you experience unpleasant side effects which may include dry mouth, dizziness or visual disturbances. 3. Avoid touching the patch. Wash your hands with soap and water after contact with the patch.

## 2020-02-20 NOTE — Transfer of Care (Signed)
Immediate Anesthesia Transfer of Care Note  Patient: Erika Baker  Procedure(s) Performed: LEFT BREAST LUMPECTOMY WITH RADIOACTIVE SEED LOCALIZATION (Left Breast)  Patient Location: PACU  Anesthesia Type:General  Level of Consciousness: drowsy  Airway & Oxygen Therapy: Patient Spontanous Breathing and Patient connected to face mask oxygen  Post-op Assessment: Report given to RN and Post -op Vital signs reviewed and stable  Post vital signs: Reviewed and stable  Last Vitals:  Vitals Value Taken Time  BP 112/58 02/20/20 1203  Temp    Pulse 65 02/20/20 1205  Resp 15 02/20/20 1205  SpO2 100 % 02/20/20 1205  Vitals shown include unvalidated device data.  Last Pain:  Vitals:   02/20/20 1005  TempSrc: Oral  PainSc: 0-No pain         Complications: No complications documented.

## 2020-02-20 NOTE — Interval H&P Note (Signed)
History and Physical Interval Note:  02/20/2020 10:44 AM  Erika Baker  has presented today for surgery, with the diagnosis of LEFT BREAST ATYPICAL DUCTAL HYPERPLASIA.  The various methods of treatment have been discussed with the patient and family. After consideration of risks, benefits and other options for treatment, the patient has consented to  Procedure(s) with comments: LEFT BREAST LUMPECTOMY WITH RADIOACTIVE SEED LOCALIZATION (Left) - LMA as a surgical intervention.  The patient's history has been reviewed, patient examined, no change in status, stable for surgery.  I have reviewed the patient's chart and labs.  Questions were answered to the patient's satisfaction.     Maia Petties

## 2020-02-20 NOTE — Anesthesia Procedure Notes (Signed)
Procedure Name: LMA Insertion Date/Time: 02/20/2020 11:21 AM Performed by: Ezequiel Kayser, CRNA Pre-anesthesia Checklist: Patient identified, Emergency Drugs available, Suction available and Patient being monitored Patient Re-evaluated:Patient Re-evaluated prior to induction Oxygen Delivery Method: Circle System Utilized Preoxygenation: Pre-oxygenation with 100% oxygen Induction Type: IV induction Ventilation: Mask ventilation without difficulty LMA: LMA inserted LMA Size: 4.0 Number of attempts: 1 Airway Equipment and Method: Bite block Placement Confirmation: positive ETCO2 Tube secured with: Tape Dental Injury: Teeth and Oropharynx as per pre-operative assessment  Comments: Eyes taped prior to insertion

## 2020-02-20 NOTE — Op Note (Signed)
Pre-op Diagnosis:  Left breast atypical ductal hyperplasia Post-op Diagnosis: same Procedure:  Left radioactive seed localized lumpectomy Surgeon:  Loraine Bhullar K. Anesthesia:  GEN - LMA Indications:  This is a 70 year old female with a history of hypertension but no family history of breast cancer or breast problems who presents with a finding on a recent routine screening mammogram. In the left breast at 9:00 middle depth 6.5 cm from the nipple she was found to have a 0.5 cm group of calcifications. Subsequent imaging showed a 7 mm area that was subsequently biopsied under stereotactic guidance. Pathology showed atypical ductal hyperplasia with calcifications.  She presents now for lumpectomy.  Description of procedure: The patient is brought to the operating room placed in supine position on the operating room table. After an adequate level of general anesthesia was obtained, her left breast was prepped with ChloraPrep and draped in sterile fashion. A timeout was taken to ensure the proper patient and proper procedure. We interrogated the breast with the neoprobe. We made a transverse incision in the upper inner quadrant of the breast after infiltrating with 0.25% Marcaine. Dissection was carried down in the breast tissue with cautery. We used the neoprobe to guide [place] towards the radioactive seed. We excised an area of tissue around the radioactive seed 1.5 cm in diameter. The specimen was removed and was oriented with a paint kit. Specimen mammogram showed the radioactive seed as well as the biopsy clip within the specimen. This was sent for pathologic examination. There is no residual radioactivity within the biopsy cavity. We inspected carefully for hemostasis. The wound was thoroughly irrigated. The wound was closed with a deep layer of 3-0 Vicryl and a subcuticular layer of 4-0 Monocryl. Benzoin Steri-Strips were applied. The patient was then extubated and brought to the recovery room in stable  condition. All sponge, instrument, and needle counts are correct.  Imogene Burn. Georgette Dover, MD, [facility]  General/ Trauma Surgery  02/20/2020 11:58 AM

## 2020-02-21 ENCOUNTER — Encounter (HOSPITAL_BASED_OUTPATIENT_CLINIC_OR_DEPARTMENT_OTHER): Payer: Self-pay | Admitting: Surgery

## 2020-02-21 NOTE — Anesthesia Postprocedure Evaluation (Signed)
Anesthesia Post Note  Patient: Erika Baker  Procedure(s) Performed: LEFT BREAST LUMPECTOMY WITH RADIOACTIVE SEED LOCALIZATION (Left Breast)     Patient location during evaluation: PACU Anesthesia Type: General Level of consciousness: sedated and patient cooperative Pain management: pain level controlled Vital Signs Assessment: post-procedure vital signs reviewed and stable Respiratory status: spontaneous breathing Cardiovascular status: stable Anesthetic complications: no   No complications documented.  Last Vitals:  Vitals:   02/20/20 1230 02/20/20 1239  BP: 127/68 120/79  Pulse: 75 (!) 58  Resp: 17 18  Temp:  36.6 C  SpO2: 99% 97%    Last Pain:  Vitals:   02/20/20 1239  TempSrc:   PainSc: 0-No pain                 Nolon Nations

## 2020-02-22 LAB — SURGICAL PATHOLOGY

## 2020-03-18 DIAGNOSIS — E782 Mixed hyperlipidemia: Secondary | ICD-10-CM | POA: Diagnosis not present

## 2020-03-18 DIAGNOSIS — I10 Essential (primary) hypertension: Secondary | ICD-10-CM | POA: Diagnosis not present

## 2020-04-17 DIAGNOSIS — I10 Essential (primary) hypertension: Secondary | ICD-10-CM | POA: Diagnosis not present

## 2020-04-17 DIAGNOSIS — E876 Hypokalemia: Secondary | ICD-10-CM | POA: Diagnosis not present

## 2020-04-17 DIAGNOSIS — E782 Mixed hyperlipidemia: Secondary | ICD-10-CM | POA: Diagnosis not present

## 2020-05-19 DIAGNOSIS — E559 Vitamin D deficiency, unspecified: Secondary | ICD-10-CM | POA: Diagnosis not present

## 2020-05-19 DIAGNOSIS — I10 Essential (primary) hypertension: Secondary | ICD-10-CM | POA: Diagnosis not present

## 2020-05-19 DIAGNOSIS — E782 Mixed hyperlipidemia: Secondary | ICD-10-CM | POA: Diagnosis not present

## 2020-05-19 DIAGNOSIS — E663 Overweight: Secondary | ICD-10-CM | POA: Diagnosis not present

## 2020-05-19 DIAGNOSIS — M25512 Pain in left shoulder: Secondary | ICD-10-CM | POA: Diagnosis not present

## 2020-05-19 DIAGNOSIS — E870 Hyperosmolality and hypernatremia: Secondary | ICD-10-CM | POA: Diagnosis not present

## 2020-05-19 DIAGNOSIS — E7849 Other hyperlipidemia: Secondary | ICD-10-CM | POA: Diagnosis not present

## 2020-05-19 DIAGNOSIS — R7303 Prediabetes: Secondary | ICD-10-CM | POA: Diagnosis not present

## 2020-05-19 DIAGNOSIS — E785 Hyperlipidemia, unspecified: Secondary | ICD-10-CM | POA: Diagnosis not present

## 2020-05-30 DIAGNOSIS — E785 Hyperlipidemia, unspecified: Secondary | ICD-10-CM | POA: Diagnosis not present

## 2020-05-30 DIAGNOSIS — R7303 Prediabetes: Secondary | ICD-10-CM | POA: Diagnosis not present

## 2020-05-30 DIAGNOSIS — E559 Vitamin D deficiency, unspecified: Secondary | ICD-10-CM | POA: Diagnosis not present

## 2020-05-30 DIAGNOSIS — I10 Essential (primary) hypertension: Secondary | ICD-10-CM | POA: Diagnosis not present

## 2020-06-03 DIAGNOSIS — R7303 Prediabetes: Secondary | ICD-10-CM | POA: Diagnosis not present

## 2020-06-03 DIAGNOSIS — E663 Overweight: Secondary | ICD-10-CM | POA: Diagnosis not present

## 2020-06-03 DIAGNOSIS — E782 Mixed hyperlipidemia: Secondary | ICD-10-CM | POA: Diagnosis not present

## 2020-06-03 DIAGNOSIS — M25512 Pain in left shoulder: Secondary | ICD-10-CM | POA: Diagnosis not present

## 2020-06-03 DIAGNOSIS — I10 Essential (primary) hypertension: Secondary | ICD-10-CM | POA: Diagnosis not present

## 2020-06-03 DIAGNOSIS — E870 Hyperosmolality and hypernatremia: Secondary | ICD-10-CM | POA: Diagnosis not present

## 2020-06-03 DIAGNOSIS — E559 Vitamin D deficiency, unspecified: Secondary | ICD-10-CM | POA: Diagnosis not present

## 2020-06-03 DIAGNOSIS — E785 Hyperlipidemia, unspecified: Secondary | ICD-10-CM | POA: Diagnosis not present

## 2020-07-18 DIAGNOSIS — I10 Essential (primary) hypertension: Secondary | ICD-10-CM | POA: Diagnosis not present

## 2020-07-18 DIAGNOSIS — E7889 Other lipoprotein metabolism disorders: Secondary | ICD-10-CM | POA: Diagnosis not present

## 2020-08-18 DIAGNOSIS — E7849 Other hyperlipidemia: Secondary | ICD-10-CM | POA: Diagnosis not present

## 2020-08-18 DIAGNOSIS — I10 Essential (primary) hypertension: Secondary | ICD-10-CM | POA: Diagnosis not present

## 2020-09-04 DIAGNOSIS — H524 Presbyopia: Secondary | ICD-10-CM | POA: Diagnosis not present

## 2020-09-04 DIAGNOSIS — H5203 Hypermetropia, bilateral: Secondary | ICD-10-CM | POA: Diagnosis not present

## 2020-09-04 DIAGNOSIS — H52209 Unspecified astigmatism, unspecified eye: Secondary | ICD-10-CM | POA: Diagnosis not present

## 2020-09-18 DIAGNOSIS — I10 Essential (primary) hypertension: Secondary | ICD-10-CM | POA: Diagnosis not present

## 2020-09-18 DIAGNOSIS — E7849 Other hyperlipidemia: Secondary | ICD-10-CM | POA: Diagnosis not present

## 2020-10-18 DIAGNOSIS — I10 Essential (primary) hypertension: Secondary | ICD-10-CM | POA: Diagnosis not present

## 2020-10-18 DIAGNOSIS — E7849 Other hyperlipidemia: Secondary | ICD-10-CM | POA: Diagnosis not present

## 2020-11-29 DIAGNOSIS — I10 Essential (primary) hypertension: Secondary | ICD-10-CM | POA: Diagnosis not present

## 2020-11-29 DIAGNOSIS — E559 Vitamin D deficiency, unspecified: Secondary | ICD-10-CM | POA: Diagnosis not present

## 2020-11-29 DIAGNOSIS — R7303 Prediabetes: Secondary | ICD-10-CM | POA: Diagnosis not present

## 2020-12-04 DIAGNOSIS — E663 Overweight: Secondary | ICD-10-CM | POA: Diagnosis not present

## 2020-12-04 DIAGNOSIS — E782 Mixed hyperlipidemia: Secondary | ICD-10-CM | POA: Diagnosis not present

## 2020-12-04 DIAGNOSIS — Z0001 Encounter for general adult medical examination with abnormal findings: Secondary | ICD-10-CM | POA: Diagnosis not present

## 2020-12-04 DIAGNOSIS — E785 Hyperlipidemia, unspecified: Secondary | ICD-10-CM | POA: Diagnosis not present

## 2020-12-04 DIAGNOSIS — E559 Vitamin D deficiency, unspecified: Secondary | ICD-10-CM | POA: Diagnosis not present

## 2020-12-04 DIAGNOSIS — E870 Hyperosmolality and hypernatremia: Secondary | ICD-10-CM | POA: Diagnosis not present

## 2020-12-04 DIAGNOSIS — R7303 Prediabetes: Secondary | ICD-10-CM | POA: Diagnosis not present

## 2020-12-04 DIAGNOSIS — I10 Essential (primary) hypertension: Secondary | ICD-10-CM | POA: Diagnosis not present

## 2020-12-04 DIAGNOSIS — Z23 Encounter for immunization: Secondary | ICD-10-CM | POA: Diagnosis not present

## 2020-12-05 DIAGNOSIS — H04123 Dry eye syndrome of bilateral lacrimal glands: Secondary | ICD-10-CM | POA: Diagnosis not present

## 2020-12-05 DIAGNOSIS — H25813 Combined forms of age-related cataract, bilateral: Secondary | ICD-10-CM | POA: Diagnosis not present

## 2020-12-05 DIAGNOSIS — H1045 Other chronic allergic conjunctivitis: Secondary | ICD-10-CM | POA: Diagnosis not present

## 2021-02-18 DIAGNOSIS — I10 Essential (primary) hypertension: Secondary | ICD-10-CM | POA: Diagnosis not present

## 2021-02-18 DIAGNOSIS — E782 Mixed hyperlipidemia: Secondary | ICD-10-CM | POA: Diagnosis not present

## 2021-02-20 DIAGNOSIS — Z1231 Encounter for screening mammogram for malignant neoplasm of breast: Secondary | ICD-10-CM | POA: Diagnosis not present

## 2021-05-27 ENCOUNTER — Encounter (HOSPITAL_COMMUNITY): Payer: Self-pay

## 2021-05-29 DIAGNOSIS — E785 Hyperlipidemia, unspecified: Secondary | ICD-10-CM | POA: Diagnosis not present

## 2021-05-29 DIAGNOSIS — R7303 Prediabetes: Secondary | ICD-10-CM | POA: Diagnosis not present

## 2021-05-29 DIAGNOSIS — I10 Essential (primary) hypertension: Secondary | ICD-10-CM | POA: Diagnosis not present

## 2021-05-29 DIAGNOSIS — E559 Vitamin D deficiency, unspecified: Secondary | ICD-10-CM | POA: Diagnosis not present

## 2021-05-29 DIAGNOSIS — E782 Mixed hyperlipidemia: Secondary | ICD-10-CM | POA: Diagnosis not present

## 2021-06-03 DIAGNOSIS — E785 Hyperlipidemia, unspecified: Secondary | ICD-10-CM | POA: Diagnosis not present

## 2021-06-03 DIAGNOSIS — R809 Proteinuria, unspecified: Secondary | ICD-10-CM | POA: Diagnosis not present

## 2021-06-03 DIAGNOSIS — E663 Overweight: Secondary | ICD-10-CM | POA: Diagnosis not present

## 2021-06-03 DIAGNOSIS — E559 Vitamin D deficiency, unspecified: Secondary | ICD-10-CM | POA: Diagnosis not present

## 2021-06-03 DIAGNOSIS — E782 Mixed hyperlipidemia: Secondary | ICD-10-CM | POA: Diagnosis not present

## 2021-06-03 DIAGNOSIS — Z0001 Encounter for general adult medical examination with abnormal findings: Secondary | ICD-10-CM | POA: Diagnosis not present

## 2021-06-03 DIAGNOSIS — I10 Essential (primary) hypertension: Secondary | ICD-10-CM | POA: Diagnosis not present

## 2021-06-03 DIAGNOSIS — M25512 Pain in left shoulder: Secondary | ICD-10-CM | POA: Diagnosis not present

## 2021-06-03 DIAGNOSIS — R7303 Prediabetes: Secondary | ICD-10-CM | POA: Diagnosis not present

## 2021-11-27 DIAGNOSIS — R7303 Prediabetes: Secondary | ICD-10-CM | POA: Diagnosis not present

## 2021-11-27 DIAGNOSIS — Z23 Encounter for immunization: Secondary | ICD-10-CM | POA: Diagnosis not present

## 2021-11-27 DIAGNOSIS — E782 Mixed hyperlipidemia: Secondary | ICD-10-CM | POA: Diagnosis not present

## 2021-11-27 DIAGNOSIS — E785 Hyperlipidemia, unspecified: Secondary | ICD-10-CM | POA: Diagnosis not present

## 2021-11-27 DIAGNOSIS — I10 Essential (primary) hypertension: Secondary | ICD-10-CM | POA: Diagnosis not present

## 2021-11-27 DIAGNOSIS — E559 Vitamin D deficiency, unspecified: Secondary | ICD-10-CM | POA: Diagnosis not present

## 2021-12-04 DIAGNOSIS — E782 Mixed hyperlipidemia: Secondary | ICD-10-CM | POA: Diagnosis not present

## 2021-12-04 DIAGNOSIS — L680 Hirsutism: Secondary | ICD-10-CM | POA: Diagnosis not present

## 2021-12-04 DIAGNOSIS — R7303 Prediabetes: Secondary | ICD-10-CM | POA: Diagnosis not present

## 2021-12-04 DIAGNOSIS — M25512 Pain in left shoulder: Secondary | ICD-10-CM | POA: Diagnosis not present

## 2021-12-04 DIAGNOSIS — I10 Essential (primary) hypertension: Secondary | ICD-10-CM | POA: Diagnosis not present

## 2021-12-04 DIAGNOSIS — E785 Hyperlipidemia, unspecified: Secondary | ICD-10-CM | POA: Diagnosis not present

## 2021-12-04 DIAGNOSIS — R809 Proteinuria, unspecified: Secondary | ICD-10-CM | POA: Diagnosis not present

## 2021-12-04 DIAGNOSIS — E663 Overweight: Secondary | ICD-10-CM | POA: Diagnosis not present

## 2021-12-04 DIAGNOSIS — E559 Vitamin D deficiency, unspecified: Secondary | ICD-10-CM | POA: Diagnosis not present

## 2021-12-16 DIAGNOSIS — H25813 Combined forms of age-related cataract, bilateral: Secondary | ICD-10-CM | POA: Diagnosis not present

## 2021-12-16 DIAGNOSIS — H04123 Dry eye syndrome of bilateral lacrimal glands: Secondary | ICD-10-CM | POA: Diagnosis not present

## 2021-12-16 DIAGNOSIS — H43813 Vitreous degeneration, bilateral: Secondary | ICD-10-CM | POA: Diagnosis not present

## 2021-12-16 DIAGNOSIS — H1045 Other chronic allergic conjunctivitis: Secondary | ICD-10-CM | POA: Diagnosis not present

## 2022-02-26 DIAGNOSIS — Z1231 Encounter for screening mammogram for malignant neoplasm of breast: Secondary | ICD-10-CM | POA: Diagnosis not present

## 2022-05-28 DIAGNOSIS — R7303 Prediabetes: Secondary | ICD-10-CM | POA: Diagnosis not present

## 2022-05-28 DIAGNOSIS — E559 Vitamin D deficiency, unspecified: Secondary | ICD-10-CM | POA: Diagnosis not present

## 2022-05-28 DIAGNOSIS — E782 Mixed hyperlipidemia: Secondary | ICD-10-CM | POA: Diagnosis not present

## 2022-05-28 DIAGNOSIS — E785 Hyperlipidemia, unspecified: Secondary | ICD-10-CM | POA: Diagnosis not present

## 2022-05-28 DIAGNOSIS — I10 Essential (primary) hypertension: Secondary | ICD-10-CM | POA: Diagnosis not present

## 2022-06-04 DIAGNOSIS — E782 Mixed hyperlipidemia: Secondary | ICD-10-CM | POA: Diagnosis not present

## 2022-06-04 DIAGNOSIS — E559 Vitamin D deficiency, unspecified: Secondary | ICD-10-CM | POA: Diagnosis not present

## 2022-06-04 DIAGNOSIS — Z0001 Encounter for general adult medical examination with abnormal findings: Secondary | ICD-10-CM | POA: Diagnosis not present

## 2022-06-04 DIAGNOSIS — M25512 Pain in left shoulder: Secondary | ICD-10-CM | POA: Diagnosis not present

## 2022-06-04 DIAGNOSIS — I10 Essential (primary) hypertension: Secondary | ICD-10-CM | POA: Diagnosis not present

## 2022-06-04 DIAGNOSIS — E663 Overweight: Secondary | ICD-10-CM | POA: Diagnosis not present

## 2022-06-04 DIAGNOSIS — R7303 Prediabetes: Secondary | ICD-10-CM | POA: Diagnosis not present

## 2022-06-04 DIAGNOSIS — R809 Proteinuria, unspecified: Secondary | ICD-10-CM | POA: Diagnosis not present

## 2022-06-04 DIAGNOSIS — Z0000 Encounter for general adult medical examination without abnormal findings: Secondary | ICD-10-CM | POA: Diagnosis not present

## 2022-09-08 DIAGNOSIS — R7303 Prediabetes: Secondary | ICD-10-CM | POA: Diagnosis not present

## 2022-09-08 DIAGNOSIS — I10 Essential (primary) hypertension: Secondary | ICD-10-CM | POA: Diagnosis not present

## 2022-09-08 DIAGNOSIS — E559 Vitamin D deficiency, unspecified: Secondary | ICD-10-CM | POA: Diagnosis not present

## 2022-09-08 DIAGNOSIS — E785 Hyperlipidemia, unspecified: Secondary | ICD-10-CM | POA: Diagnosis not present

## 2022-09-14 DIAGNOSIS — E782 Mixed hyperlipidemia: Secondary | ICD-10-CM | POA: Diagnosis not present

## 2022-09-14 DIAGNOSIS — R7303 Prediabetes: Secondary | ICD-10-CM | POA: Diagnosis not present

## 2022-09-14 DIAGNOSIS — E559 Vitamin D deficiency, unspecified: Secondary | ICD-10-CM | POA: Diagnosis not present

## 2022-09-14 DIAGNOSIS — R809 Proteinuria, unspecified: Secondary | ICD-10-CM | POA: Diagnosis not present

## 2022-09-14 DIAGNOSIS — Z0001 Encounter for general adult medical examination with abnormal findings: Secondary | ICD-10-CM | POA: Diagnosis not present

## 2022-09-14 DIAGNOSIS — M25512 Pain in left shoulder: Secondary | ICD-10-CM | POA: Diagnosis not present

## 2022-09-14 DIAGNOSIS — E663 Overweight: Secondary | ICD-10-CM | POA: Diagnosis not present

## 2022-09-14 DIAGNOSIS — I10 Essential (primary) hypertension: Secondary | ICD-10-CM | POA: Diagnosis not present

## 2022-09-14 DIAGNOSIS — E785 Hyperlipidemia, unspecified: Secondary | ICD-10-CM | POA: Diagnosis not present

## 2022-09-28 DIAGNOSIS — R7303 Prediabetes: Secondary | ICD-10-CM | POA: Diagnosis not present

## 2022-09-28 DIAGNOSIS — Z79899 Other long term (current) drug therapy: Secondary | ICD-10-CM | POA: Diagnosis not present

## 2022-09-28 DIAGNOSIS — E785 Hyperlipidemia, unspecified: Secondary | ICD-10-CM | POA: Diagnosis not present

## 2022-09-28 DIAGNOSIS — I10 Essential (primary) hypertension: Secondary | ICD-10-CM | POA: Diagnosis not present

## 2022-09-28 DIAGNOSIS — E559 Vitamin D deficiency, unspecified: Secondary | ICD-10-CM | POA: Diagnosis not present

## 2022-12-16 DIAGNOSIS — Z23 Encounter for immunization: Secondary | ICD-10-CM | POA: Diagnosis not present

## 2022-12-22 DIAGNOSIS — H1045 Other chronic allergic conjunctivitis: Secondary | ICD-10-CM | POA: Diagnosis not present

## 2022-12-22 DIAGNOSIS — H04123 Dry eye syndrome of bilateral lacrimal glands: Secondary | ICD-10-CM | POA: Diagnosis not present

## 2022-12-22 DIAGNOSIS — H25813 Combined forms of age-related cataract, bilateral: Secondary | ICD-10-CM | POA: Diagnosis not present

## 2022-12-22 DIAGNOSIS — H43813 Vitreous degeneration, bilateral: Secondary | ICD-10-CM | POA: Diagnosis not present

## 2023-03-04 DIAGNOSIS — Z1231 Encounter for screening mammogram for malignant neoplasm of breast: Secondary | ICD-10-CM | POA: Diagnosis not present

## 2023-03-25 DIAGNOSIS — I10 Essential (primary) hypertension: Secondary | ICD-10-CM | POA: Diagnosis not present

## 2023-03-25 DIAGNOSIS — E785 Hyperlipidemia, unspecified: Secondary | ICD-10-CM | POA: Diagnosis not present

## 2023-03-25 DIAGNOSIS — R7303 Prediabetes: Secondary | ICD-10-CM | POA: Diagnosis not present

## 2023-03-25 DIAGNOSIS — E559 Vitamin D deficiency, unspecified: Secondary | ICD-10-CM | POA: Diagnosis not present

## 2023-04-01 ENCOUNTER — Other Ambulatory Visit (HOSPITAL_COMMUNITY): Payer: Self-pay | Admitting: Nurse Practitioner

## 2023-04-01 ENCOUNTER — Encounter (HOSPITAL_COMMUNITY): Payer: Self-pay | Admitting: Nurse Practitioner

## 2023-04-01 DIAGNOSIS — I10 Essential (primary) hypertension: Secondary | ICD-10-CM | POA: Diagnosis not present

## 2023-04-01 DIAGNOSIS — R7303 Prediabetes: Secondary | ICD-10-CM | POA: Diagnosis not present

## 2023-04-01 DIAGNOSIS — E559 Vitamin D deficiency, unspecified: Secondary | ICD-10-CM | POA: Diagnosis not present

## 2023-04-01 DIAGNOSIS — R2242 Localized swelling, mass and lump, left lower limb: Secondary | ICD-10-CM

## 2023-04-01 DIAGNOSIS — M25512 Pain in left shoulder: Secondary | ICD-10-CM | POA: Diagnosis not present

## 2023-04-01 DIAGNOSIS — R809 Proteinuria, unspecified: Secondary | ICD-10-CM | POA: Diagnosis not present

## 2023-04-01 DIAGNOSIS — M7632 Iliotibial band syndrome, left leg: Secondary | ICD-10-CM | POA: Diagnosis not present

## 2023-04-01 DIAGNOSIS — L680 Hirsutism: Secondary | ICD-10-CM | POA: Diagnosis not present

## 2023-04-01 DIAGNOSIS — E782 Mixed hyperlipidemia: Secondary | ICD-10-CM | POA: Diagnosis not present

## 2023-04-28 ENCOUNTER — Ambulatory Visit (HOSPITAL_COMMUNITY): Admission: RE | Admit: 2023-04-28 | Source: Ambulatory Visit

## 2023-04-28 ENCOUNTER — Encounter (HOSPITAL_COMMUNITY): Payer: Self-pay

## 2023-05-21 ENCOUNTER — Ambulatory Visit (HOSPITAL_COMMUNITY)
Admission: RE | Admit: 2023-05-21 | Discharge: 2023-05-21 | Disposition: A | Discharge status: Home / Self Care | Source: Ambulatory Visit | Attending: Nurse Practitioner | Admitting: Nurse Practitioner

## 2023-05-21 DIAGNOSIS — R2242 Localized swelling, mass and lump, left lower limb: Secondary | ICD-10-CM | POA: Diagnosis not present

## 2023-05-21 DIAGNOSIS — M799 Soft tissue disorder, unspecified: Secondary | ICD-10-CM | POA: Diagnosis not present

## 2023-05-21 DIAGNOSIS — M7989 Other specified soft tissue disorders: Secondary | ICD-10-CM | POA: Diagnosis not present

## 2023-05-21 MED ORDER — IOHEXOL 350 MG/ML SOLN
75.0000 mL | Freq: Once | INTRAVENOUS | Status: AC | PRN
  Administered 2023-05-21: 75 mL via INTRAVENOUS

## 2023-06-15 ENCOUNTER — Other Ambulatory Visit (HOSPITAL_COMMUNITY): Payer: Self-pay | Admitting: Nurse Practitioner

## 2023-06-15 DIAGNOSIS — M7989 Other specified soft tissue disorders: Secondary | ICD-10-CM

## 2023-06-17 ENCOUNTER — Ambulatory Visit (HOSPITAL_COMMUNITY)
Admission: RE | Admit: 2023-06-17 | Discharge: 2023-06-17 | Disposition: A | Discharge status: Home / Self Care | Source: Ambulatory Visit | Attending: Nurse Practitioner | Admitting: Nurse Practitioner

## 2023-06-17 ENCOUNTER — Encounter: Payer: Self-pay | Admitting: Medical Oncology

## 2023-06-17 DIAGNOSIS — R2242 Localized swelling, mass and lump, left lower limb: Secondary | ICD-10-CM | POA: Diagnosis not present

## 2023-06-17 DIAGNOSIS — M7989 Other specified soft tissue disorders: Secondary | ICD-10-CM | POA: Insufficient documentation

## 2023-06-17 MED ORDER — GADOBUTROL 1 MMOL/ML IV SOLN
7.0000 mL | Freq: Once | INTRAVENOUS | Status: AC | PRN
  Administered 2023-06-17: 7 mL via INTRAVENOUS

## 2023-06-17 NOTE — Progress Notes (Signed)
 [facility] [phone]   [phone]  INITIAL CONSULTATION:  Patient Care Team: Omie Bickers, MD as PCP - General (Internal Medicine)  CHIEF COMPLAINTS/PURPOSE OF CONSULTATION:  "Soft tissue mass"  HISTORY OF PRESENTING ILLNESS:  Erika Baker 73 y.o. female with medical history significant for vitamin D deficiency, mixed hyperlipidemia, hypertension, hirsutism, prediabetes.   On review of the previous records patient is being referred by Harriet Limber FNP for evaluation of a soft tissue mass in her left lower extremity. Patient had contrasted CT left hip on 05/21/23 and the report was finalized on 06/12/23 with impression 13.7 x 8.8 12.6 cm heterogeneously enhancing soft tissue mass within the quadriceps musculature with cystic areas. 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 Radiologist also comments on enhancing osseous lesions in the left femoral diaphysis an left iliac bone that are concerning for metastatic disease.  Patient presents unaccompanied for visit today. She reports the mass has been present in her left thigh for over a year. Initially, it did not cause any discomfort, but she began experiencing pain after her husband's passing in March 2024. The pain is described as soreness and sometimes throbbing, particularly when standing up after sitting for a long time. It occasionally radiates to her knee, and she experiences swelling in her feet and leg. The pain affects her walking, causing her to limp, although she cannot recall when the limping started. She has not taken any pain medication for this issue and has not seen an orthopedist. 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  She is retired, previously worked in Immunologist. She is a never smoker and does not consume alcohol. She has no family history of cancer. She had a mammogram in 02/2023 that was normal. Her last colonoscopy was in 2022.  MEDICAL HISTORY:  Past Medical History:  Diagnosis Date   Asthma    no problems in 26yrs   Atypical ductal hyperplasia of left breast    Hyperlipidemia    Hypertension     SURGICAL HISTORY: Past Surgical History:  Procedure Laterality Date   ABDOMINAL HYSTERECTOMY     partial    BREAST LUMPECTOMY WITH RADIOACTIVE SEED LOCALIZATION Left 02/20/2020   Procedure: LEFT BREAST LUMPECTOMY WITH RADIOACTIVE SEED LOCALIZATION;  Surgeon: Dareen Ebbing, MD;  Location: [facility];  Service: General;  Laterality: Left;  LMA   COLONOSCOPY     CYSTECTOMY Right    shoulder    SOCIAL HISTORY: Social History   Socioeconomic History   Marital status: Widowed    Spouse name: Not on file   Number of children: Not on file   Years of education: Not on file   Highest education level: Not on file  Occupational History   Not on file  Tobacco Use   Smoking status: Never   Smokeless tobacco: Never  Substance and Sexual Activity   Alcohol use: Never   Drug use: Never   Sexual activity: Not on file  Other Topics Concern   Not on file  Social History Narrative   Not on file   Social Drivers of Health   Financial Resource Strain: Not on file  Food Insecurity: Not on file  Transportation Needs: Not on  file  Physical Activity: Not on file  Stress: Not on file  Social Connections: Not on file  Intimate Partner Violence: Not on file    FAMILY HISTORY: No family history on file.  ALLERGIES:  has no known allergies.  MEDICATIONS:  Current Outpatient Medications  Medication Sig Dispense Refill   albuterol (VENTOLIN HFA) 108 (90 Base) MCG/ACT inhaler Inhale into the lungs every 6 (six) hours as needed for wheezing or shortness of breath.      amLODipine (NORVASC) 10 MG tablet Take 5 mg by mouth daily.     atorvastatin (LIPITOR) 10 MG tablet Take 40 mg by mouth daily.      losartan (COZAAR) 25 MG tablet Take 100 mg by mouth daily.     No current facility-administered medications for this visit.    REVIEW OF SYSTEMS:   All other systems are reviewed and are negative for acute change except as noted in the HPI.  PHYSICAL EXAMINATION: ECOG PERFORMANCE STATUS: 1 - Symptomatic but completely ambulatory  Vitals:   06/18/23 1209 06/18/23 1211  BP: (!) 142/72 130/65  Pulse: (!) 59   Resp: 16   Temp: 98.3 F (36.8 C)   SpO2: 97%    Filed Weights   06/18/23 1209  Weight: 162 lb 3.2 oz (73.6 kg)    Physical Exam Vitals reviewed.  Constitutional:      Appearance: She is not ill-appearing or toxic-appearing.  HENT:     Head: Normocephalic.     Right Ear: External ear normal.     Left Ear: External ear normal.     Nose: Nose normal.  Eyes:     General: No scleral icterus.    Conjunctiva/sclera: Conjunctivae normal.  Cardiovascular:     Rate and Rhythm: Normal rate.     Pulses:          Dorsalis pedis pulses are 2+ on the left side.  Pulmonary:     Effort: Pulmonary effort is normal.  Abdominal:     General: There is no distension.  Musculoskeletal:     Cervical back: Normal range of motion.     Right lower leg: No edema.     Left lower leg: No edema.     Comments: Antalgic gait.  Hard mass on left lateral thigh without overlying skin changes. No bony tenderness. 5/5 strength in BLE. Sensation intact.  Skin:    General: Skin is warm and dry.  Neurological:     Mental Status: She is alert.  Psychiatric:        Mood and Affect: Affect is tearful.       LABORATORY DATA:  I have reviewed the data as listed    Latest Ref Rng & Units 06/18/2023    1:10 PM 06/25/2017    4:08 AM  CBC  WBC 4.0 - 10.5 K/uL 6.0  7.8   Hemoglobin 12.0 - 15.0 g/dL 26.9  48.5   Hematocrit 36.0 - 46.0 % 40.5  47.8   Platelets 150  - 400 K/uL 224  256        Latest Ref Rng & Units 06/25/2017    4:08 AM  CMP  Glucose 65 - 99 mg/dL 462   BUN 6 - 20 mg/dL 15   Creatinine 7.03 - 1.00 mg/dL 5.00   Sodium 938 - 182 mmol/L 140   Potassium 3.5 - 5.1 mmol/L 3.6   Chloride 101 - 111 mmol/L 104   CO2 22 - 32 mmol/L 26   Calcium 8.9 -  10.3 mg/dL 9.8   Total Protein 6.5 - 8.1 g/dL 7.2   Total Bilirubin 0.3 - 1.2 mg/dL 1.0   Alkaline Phos 38 - 126 U/L 74   AST 15 - 41 U/L 28   ALT 14 - 54 U/L 23      RADIOGRAPHIC STUDIES: I have personally reviewed the radiological images as listed and agreed with the findings in the report. MR FEMUR LEFT W WO CONTRAST Result Date: 06/17/2023 CLINICAL DATA:  Soft tissue mass of the left thigh. EXAM: MR OF THE LEFT LOWER EXTREMITY WITHOUT AND WITH CONTRAST TECHNIQUE: Multiplanar, multisequence MR imaging of the left femur was performed both before and after administration of intravenous contrast. CONTRAST:  7mL GADAVIST  GADOBUTROL  1 MMOL/ML IV SOLN COMPARISON:  CT of the left hip dated 06/12/2023. FINDINGS: Bones/Joint/Cartilage A 2.1 x 1.8 x 2.9 cm T2 hyperintense, T1 hypointense, enhancing lesion in the left proximal femoral diaphysis is concerning for metastatic osseous disease (series 20, image 26 and series 23, image 21). This lesion demonstrates cortical thinning posteriorly. Similar-appearing 0.6 x 0.7 x 0.9 cm lesion in the mid to distal left femoral diaphysis (series 20, image 55 and series 23, image 22) and 1.4 x 0.9 x 1.4 cm lesion in the left iliac bone at the level of the left sacroiliac joint (series 20, image 2 and series 23, image 9) are also concerning for osseous metastatic disease. No acute fracture or dislocation.  No joint effusion. Ligaments Collateral ligaments are intact. Muscles and Tendons 13.7 x 13.3 x 22 cm large polylobulated and multi septated heterogenous enhancing lesion within the proximal to mid left quadriceps musculature, most predominantly involving the vastus  lateralis muscle with effacement of the fat planes of the adjacent vastus medialis and vastus intermedius muscles, concerning for intramuscular extension. This large heterogenous enhancing mass abuts the undersurface of the overlying rectus femoris and sartorius muscles as well as the anterior distal margin of the left gluteus minimus and maximus muscles. Areas of nonenhancing intermediate to increased T1 signal and layering T2 hyperintense fluid within this mass lesion likely reflects necrosis with blood products (series 20, images 25 and 34 and series 21, image 27). This lesion medially displaces the adjacent traversing superficial and profunda femoral arteries with relative preservation of surrounding fat planes. Left hamstring origin tendon is intact. Quadriceps tendon and visualized patellar tendon are intact. Soft tissue Subcutaneous edema and soft tissue swelling along the anterolateral left thigh. No loculated subcutaneous fluid collection. No enlarged lymph nodes identified in the field of view. IMPRESSION: 1. 13.7 x 13.3 x 22 cm large polylobulated and multi-septated heterogenous enhancing soft tissue mass within the proximal to mid left quadriceps musculature with internal regions of necrosis and blood products. This mass predominantly involves the vastus lateralis muscle with effacement of the fat planes of the adjacent vastus medialis, vastus intermedius, gluteus minimus and medius muscles, concerning for possible intramuscular extension. There is associated medial displacement of the adjacent traversing superficial and profunda femoral arteries with relative preservation of surrounding fat planes. 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 Recommend further characterization with direct tissue sampling. 2. Enhancing osseous lesions in the left femoral diaphysis and left iliac bone are concerning for metastatic disease, as described above. The larger enhancing lesion in the proximal left  femoral diaphysis demonstrates posterior cortical thinning. Electronically Signed   By: Mannie Seek M.D.   On: 06/17/2023 18:59   CT HIP LEFT W CONTRAST Result Date: 06/12/2023 CLINICAL DATA:  Left thigh mass  EXAM: CT OF THE LOWER LEFT EXTREMITY WITH CONTRAST TECHNIQUE: Multidetector CT imaging of the lower left extremity was performed according to the standard protocol following intravenous contrast administration. RADIATION DOSE REDUCTION: This exam was performed according to the departmental dose-optimization program which includes automated exposure control, adjustment of the mA and/or kV according to patient size and/or use of iterative reconstruction technique. CONTRAST:  75mL OMNIPAQUE  IOHEXOL  350 MG/ML SOLN COMPARISON:  None Available. FINDINGS: Bones/Joint/Cartilage No fracture or dislocation. Normal alignment. No joint effusion. Ligaments Ligaments are suboptimally evaluated by CT. Muscles and Tendons 13.7 x 8.8 x 12.6 cm heterogeneously enhancing soft tissue mass within the quadriceps musculature with cystic areas. No other soft tissue mass. No muscle atrophy. Soft tissue No fluid collection or hematoma. No soft tissue mass. Soft tissue edema and skin thickening along the anterior thigh. No lymphadenopathy. IMPRESSION: 1. A 13.7 x 8.8 x 12.6 cm heterogeneously enhancing soft tissue mass within the quadriceps musculature with cystic areas likely reflecting areas of necrosis. 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 Recommend further evaluation with an MRI of the left thigh with and without contrast and tissue diagnosis. Electronically Signed   By: Onnie Bilis M.D.   On: 06/12/2023 08:00    ASSESSMENT & PLAN ARIANIE COUSE is a 73 y.o. female presenting to the [facility] for consultation regarding soft tissue mass concerning for soft tissue sarcoma. Patient will proceed with laboratory workup today.   #Soft tissue mass - Reviewed imaging CT left hip and MR left femur  with patient - Will collect labs today including CBC, CMP, LDH - Patient will need further work up at tertiary center. STAT referral faxed to Dr. Read Camel orthopedic oncologist. Patient voiced concern about transportation to [facility]. She was encouraged to call her insurance provider to inquire about transportation services and also given information for [employer], broadcasting/film/video through [employer]. Also encouraged patient to reach out to family members to for support. - Provided patient with phone number for grief counseling support through hospice  #Age related screenings - UTD   Patient expressed understanding of the recommended workup and is agreeable to move forward.   All questions were answered. The patient knows to call the clinic with any problems, questions or concerns.  Shared visit with Dr. Rosaline Coma.  No orders of the defined types were placed in this encounter.     I have spent a total of 60 minutes minutes of face-to-face and non-face-to-face time, preparing to see the patient, obtaining and/or reviewing separately obtained history, performing a medically appropriate examination, counseling and educating the patient, ordering medications/tests/procedures, referring and communicating with other health care professionals, documenting clinical information in the electronic health record, independently interpreting results and communicating results to the patient, and care coordination.   Taishaun Levels Walisiewicz PA-C Department of Hematology/Oncology [facility] at [facility] Phone: [phone]  I have read the above note and personally examined the patient. I agree with the assessment and plan as noted above.  Briefly Mrs. Janetta Vandoren is a 73 year old female who presents for evaluation of a concerning lesion within her muscle.  The patient underwent a CT scan of her left hip on 05/21/2023.  That scan  showed a 13.7 x 8.8 x 12.6 heterogeneous enhancing soft tissue mass in the quadriceps musculature.  Findings were concerning for a sarcoma.  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  Due to concern of these findings the patient was referred to our service for further evaluation and management.  At this time would recommend consideration of full resection of this mass.  Could consider biopsy prior to excision, however will defer that to the sarcoma experts.  Recommend the patient be referred to [facility] where orthopedic oncology or surgical oncology would be able to successfully remove this mass.  If the patient is not able to be seen in a timely fashion could consider full CT chest abdomen pelvis to assure no metastatic disease.  The patient voiced understanding of our findings and the plan moving forward.  Of note the patient is deeply concerned about her ability to back and forth to [facility].  In the event that the patient would require adjuvant therapy we could provide it here locally based on the recommendations of the sarcoma center.  Additionally the patient is still grieving from the loss of her husband last year and is sad that there is no one to help her through this tough time.   Rogerio Clay, MD Department of Hematology/Oncology [facility] at [facility] Phone: [phone] Pager: [phone] Email: [email or web address]

## 2023-06-18 ENCOUNTER — Encounter: Payer: Self-pay | Admitting: Medical Oncology

## 2023-06-18 ENCOUNTER — Inpatient Hospital Stay

## 2023-06-18 ENCOUNTER — Inpatient Hospital Stay: Attending: Physician Assistant | Admitting: Physician Assistant

## 2023-06-18 ENCOUNTER — Other Ambulatory Visit: Payer: Self-pay

## 2023-06-18 VITALS — BP 130/65 | HR 59 | Temp 98.3°F | Resp 16 | Wt 162.2 lb

## 2023-06-18 DIAGNOSIS — M7989 Other specified soft tissue disorders: Secondary | ICD-10-CM | POA: Diagnosis not present

## 2023-06-18 DIAGNOSIS — I10 Essential (primary) hypertension: Secondary | ICD-10-CM | POA: Insufficient documentation

## 2023-06-18 DIAGNOSIS — E782 Mixed hyperlipidemia: Secondary | ICD-10-CM | POA: Insufficient documentation

## 2023-06-18 DIAGNOSIS — Z79899 Other long term (current) drug therapy: Secondary | ICD-10-CM | POA: Insufficient documentation

## 2023-06-18 DIAGNOSIS — E559 Vitamin D deficiency, unspecified: Secondary | ICD-10-CM | POA: Diagnosis not present

## 2023-06-18 LAB — CBC WITH DIFFERENTIAL (CANCER CENTER ONLY)
Abs Immature Granulocytes: 0.02 10*3/uL (ref 0.00–0.07)
Basophils Absolute: 0 10*3/uL (ref 0.0–0.1)
Basophils Relative: 1 %
Eosinophils Absolute: 0.1 10*3/uL (ref 0.0–0.5)
Eosinophils Relative: 2 %
HCT: 40.5 % (ref 36.0–46.0)
Hemoglobin: 13.9 g/dL (ref 12.0–15.0)
Immature Granulocytes: 0 %
Lymphocytes Relative: 40 %
Lymphs Abs: 2.4 10*3/uL (ref 0.7–4.0)
MCH: 30.5 pg (ref 26.0–34.0)
MCHC: 34.3 g/dL (ref 30.0–36.0)
MCV: 89 fL (ref 80.0–100.0)
Monocytes Absolute: 0.6 10*3/uL (ref 0.1–1.0)
Monocytes Relative: 9 %
Neutro Abs: 2.9 10*3/uL (ref 1.7–7.7)
Neutrophils Relative %: 48 %
Platelet Count: 224 10*3/uL (ref 150–400)
RBC: 4.55 MIL/uL (ref 3.87–5.11)
RDW: 14.2 % (ref 11.5–15.5)
WBC Count: 6 10*3/uL (ref 4.0–10.5)
nRBC: 0 % (ref 0.0–0.2)

## 2023-06-18 LAB — CMP (CANCER CENTER ONLY)
ALT: 15 U/L (ref 0–44)
AST: 26 U/L (ref 15–41)
Albumin: 4.1 g/dL (ref 3.5–5.0)
Alkaline Phosphatase: 74 U/L (ref 38–126)
Anion gap: 10 (ref 5–15)
BUN: 16 mg/dL (ref 8–23)
CO2: 25 mmol/L (ref 22–32)
Calcium: 9.8 mg/dL (ref 8.9–10.3)
Chloride: 103 mmol/L (ref 98–111)
Creatinine: 0.93 mg/dL (ref 0.44–1.00)
GFR, Estimated: >60 mL/min (ref >60)
Glucose, Bld: 92 mg/dL (ref 70–99)
Potassium: 3.7 mmol/L (ref 3.5–5.1)
Sodium: 138 mmol/L (ref 135–145)
Total Bilirubin: 0.9 mg/dL (ref 0.0–1.2)
Total Protein: 7.6 g/dL (ref 6.5–8.1)

## 2023-06-18 LAB — LACTATE DEHYDROGENASE: LDH: 234 U/L — ABNORMAL HIGH (ref 98–192)

## 2023-06-18 NOTE — Progress Notes (Signed)
 [facility]   Patient presented to clinic by herself for her scheduled appointment with Devonda Folds. I introduced myself and provided her with my direct contact information. Patient was encouraged to call me with any questions/concerns she may have.  Esperanza Hedges, RN, BSN, [facility] Oncology Nurse Navigator, [facility] 06/18/2023 1:58 PM

## 2023-06-18 NOTE — Progress Notes (Signed)
 [facility]  Urgent Referral fax sent to: Dr. Rayburn Cagey, at [facility] for ortho oncology, Per PA-C Rosalind Columbia. Fax sent to [phone]. Will follow-up with call to Dr. Dixon Fredrickson office, next week, at [phone], to ensure receipt and scheduling.   Esperanza Hedges, RN, BSN, [facility] Oncology Nurse Navigator, [facility] 06/18/2023 3:58 PM

## 2023-06-18 NOTE — Patient Instructions (Addendum)
 We are referring you to orthopedic oncologist Dr. Rayburn Cagey at [facility] for further evaluation of your leg mass.   We faxed the referral today and the office should contact you to schedule an appointment. If you do not hear from Fdr. Emory's office within 1 week please call Reginald Capri to let her know and she can try to help.   Some information about transportation:  Please call your insurance to see if you have a transportation benefit. Some of the Medicare advantage plans have it, but it all depends on the plan itself.    If your insurance does not offer transportation help then trying calling the [employer] Road to Recovery. It is a  is a volunteer-based ride program and they might have someone available to drive to an appointment. The number is [phone]    You can also try: Engineer, structural through [employer]. It is a Field seismologist the number is [phone].     If you change your mind this is the phone number for grief counseling support: [phone]

## 2023-06-19 ENCOUNTER — Other Ambulatory Visit: Payer: Self-pay

## 2023-06-19 ENCOUNTER — Emergency Department (HOSPITAL_COMMUNITY)

## 2023-06-19 ENCOUNTER — Emergency Department (HOSPITAL_COMMUNITY)
Admission: EM | Admit: 2023-06-19 | Discharge: 2023-06-19 | Disposition: A | Discharge status: Home / Self Care | Attending: Emergency Medicine | Admitting: Emergency Medicine

## 2023-06-19 ENCOUNTER — Encounter (HOSPITAL_COMMUNITY): Payer: Self-pay

## 2023-06-19 DIAGNOSIS — S3992XA Unspecified injury of lower back, initial encounter: Secondary | ICD-10-CM | POA: Diagnosis present

## 2023-06-19 DIAGNOSIS — M545 Low back pain, unspecified: Secondary | ICD-10-CM | POA: Diagnosis not present

## 2023-06-19 DIAGNOSIS — S39012A Strain of muscle, fascia and tendon of lower back, initial encounter: Secondary | ICD-10-CM | POA: Insufficient documentation

## 2023-06-19 DIAGNOSIS — M549 Dorsalgia, unspecified: Secondary | ICD-10-CM | POA: Diagnosis not present

## 2023-06-19 DIAGNOSIS — Y92410 Unspecified street and highway as the place of occurrence of the external cause: Secondary | ICD-10-CM | POA: Diagnosis not present

## 2023-06-19 DIAGNOSIS — Z041 Encounter for examination and observation following transport accident: Secondary | ICD-10-CM | POA: Diagnosis not present

## 2023-06-19 DIAGNOSIS — M47816 Spondylosis without myelopathy or radiculopathy, lumbar region: Secondary | ICD-10-CM | POA: Diagnosis not present

## 2023-06-19 NOTE — Discharge Instructions (Signed)
Take Tylenol or Motrin for pain and follow-up with your family doctor if any problems ?
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
## 2023-06-21 ENCOUNTER — Encounter: Payer: Self-pay | Admitting: Medical Oncology

## 2023-06-21 NOTE — Progress Notes (Signed)
 [facility]  Call to Dr. Dickson Founds office, at [facility] ortho oncology, regarding patient referral. Confirmed that office received patient referral faxed to them Friday,  May 30th. [facility] confirms will call patient to scheduled appointment. Informed office should they not be able to reach patient to please call me. My call back number provided. Office thanked for their assistance.   Esperanza Hedges, RN, BSN, [facility] Oncology Nurse Navigator, [facility] 06/21/2023 3:43 PM

## 2023-06-23 ENCOUNTER — Telehealth: Payer: Self-pay | Admitting: Physician Assistant

## 2023-06-23 NOTE — Telephone Encounter (Signed)
 I notified Erika Baker by phone regarding labs results. CBC and CMP were unremarkable. LDH is elevated at 234. Patient informed me that she is scheduled to see Dr. Read Camel at [facility] on 06/29/23 at 9 am. Her cousin is able to bring her to that appointment. All of patient's questions were answered and she expressed understanding of the plan provided.

## 2023-06-29 DIAGNOSIS — C4922 Malignant neoplasm of connective and soft tissue of left lower limb, including hip: Secondary | ICD-10-CM | POA: Diagnosis not present

## 2023-06-29 DIAGNOSIS — R2242 Localized swelling, mass and lump, left lower limb: Secondary | ICD-10-CM | POA: Diagnosis not present

## 2023-07-05 ENCOUNTER — Ambulatory Visit (HOSPITAL_COMMUNITY): Admission: EM | Admit: 2023-07-05 | Discharge: 2023-07-05 | Disposition: A | Discharge status: Home / Self Care

## 2023-07-05 ENCOUNTER — Encounter (HOSPITAL_COMMUNITY): Payer: Self-pay

## 2023-07-05 ENCOUNTER — Encounter (HOSPITAL_COMMUNITY): Payer: Self-pay | Admitting: Emergency Medicine

## 2023-07-05 ENCOUNTER — Other Ambulatory Visit: Payer: Self-pay

## 2023-07-05 ENCOUNTER — Inpatient Hospital Stay (HOSPITAL_COMMUNITY)
Admission: EM | Admit: 2023-07-05 | Discharge: 2023-07-12 | DRG: 071 | Disposition: A | Discharge status: Home / Self Care | Attending: Student | Admitting: Student

## 2023-07-05 ENCOUNTER — Emergency Department (HOSPITAL_COMMUNITY)

## 2023-07-05 DIAGNOSIS — I10 Essential (primary) hypertension: Secondary | ICD-10-CM | POA: Diagnosis present

## 2023-07-05 DIAGNOSIS — C801 Malignant (primary) neoplasm, unspecified: Secondary | ICD-10-CM | POA: Diagnosis not present

## 2023-07-05 DIAGNOSIS — C7801 Secondary malignant neoplasm of right lung: Secondary | ICD-10-CM | POA: Diagnosis not present

## 2023-07-05 DIAGNOSIS — Z90710 Acquired absence of both cervix and uterus: Secondary | ICD-10-CM

## 2023-07-05 DIAGNOSIS — G9389 Other specified disorders of brain: Principal | ICD-10-CM | POA: Diagnosis present

## 2023-07-05 DIAGNOSIS — Z79899 Other long term (current) drug therapy: Secondary | ICD-10-CM

## 2023-07-05 DIAGNOSIS — I639 Cerebral infarction, unspecified: Secondary | ICD-10-CM | POA: Diagnosis not present

## 2023-07-05 DIAGNOSIS — E876 Hypokalemia: Secondary | ICD-10-CM | POA: Diagnosis present

## 2023-07-05 DIAGNOSIS — R900 Intracranial space-occupying lesion found on diagnostic imaging of central nervous system: Secondary | ICD-10-CM | POA: Diagnosis present

## 2023-07-05 DIAGNOSIS — E871 Hypo-osmolality and hyponatremia: Secondary | ICD-10-CM | POA: Diagnosis not present

## 2023-07-05 DIAGNOSIS — H4921 Sixth [abducent] nerve palsy, right eye: Secondary | ICD-10-CM | POA: Diagnosis present

## 2023-07-05 DIAGNOSIS — H532 Diplopia: Secondary | ICD-10-CM | POA: Diagnosis not present

## 2023-07-05 DIAGNOSIS — H02401 Unspecified ptosis of right eyelid: Secondary | ICD-10-CM | POA: Diagnosis present

## 2023-07-05 DIAGNOSIS — E785 Hyperlipidemia, unspecified: Secondary | ICD-10-CM | POA: Diagnosis not present

## 2023-07-05 DIAGNOSIS — R519 Headache, unspecified: Secondary | ICD-10-CM

## 2023-07-05 DIAGNOSIS — G529 Cranial nerve disorder, unspecified: Secondary | ICD-10-CM | POA: Diagnosis not present

## 2023-07-05 DIAGNOSIS — C7802 Secondary malignant neoplasm of left lung: Secondary | ICD-10-CM | POA: Diagnosis not present

## 2023-07-05 DIAGNOSIS — Z602 Problems related to living alone: Secondary | ICD-10-CM | POA: Diagnosis present

## 2023-07-05 DIAGNOSIS — C782 Secondary malignant neoplasm of pleura: Secondary | ICD-10-CM | POA: Diagnosis not present

## 2023-07-05 DIAGNOSIS — C7800 Secondary malignant neoplasm of unspecified lung: Secondary | ICD-10-CM | POA: Diagnosis present

## 2023-07-05 DIAGNOSIS — J4520 Mild intermittent asthma, uncomplicated: Secondary | ICD-10-CM | POA: Diagnosis not present

## 2023-07-05 DIAGNOSIS — J45909 Unspecified asthma, uncomplicated: Secondary | ICD-10-CM | POA: Diagnosis not present

## 2023-07-05 DIAGNOSIS — R29818 Other symptoms and signs involving the nervous system: Secondary | ICD-10-CM | POA: Diagnosis not present

## 2023-07-05 DIAGNOSIS — M899 Disorder of bone, unspecified: Secondary | ICD-10-CM | POA: Diagnosis not present

## 2023-07-05 LAB — BASIC METABOLIC PANEL WITH GFR
Anion gap: 11 (ref 5–15)
BUN: 15 mg/dL (ref 8–23)
CO2: 23 mmol/L (ref 22–32)
Calcium: 10 mg/dL (ref 8.9–10.3)
Chloride: 103 mmol/L (ref 98–111)
Creatinine, Ser: 1 mg/dL (ref 0.44–1.00)
GFR, Estimated: 59 mL/min — ABNORMAL LOW (ref >60)
Glucose, Bld: 116 mg/dL — ABNORMAL HIGH (ref 70–99)
Potassium: 3 mmol/L — ABNORMAL LOW (ref 3.5–5.1)
Sodium: 137 mmol/L (ref 135–145)

## 2023-07-05 LAB — CBC WITH DIFFERENTIAL/PLATELET
Abs Immature Granulocytes: 0.03 10*3/uL (ref 0.00–0.07)
Basophils Absolute: 0 10*3/uL (ref 0.0–0.1)
Basophils Relative: 1 %
Eosinophils Absolute: 0 10*3/uL (ref 0.0–0.5)
Eosinophils Relative: 0 %
HCT: 42.9 % (ref 36.0–46.0)
Hemoglobin: 14.4 g/dL (ref 12.0–15.0)
Immature Granulocytes: 0 %
Lymphocytes Relative: 24 %
Lymphs Abs: 2 10*3/uL (ref 0.7–4.0)
MCH: 30.9 pg (ref 26.0–34.0)
MCHC: 33.6 g/dL (ref 30.0–36.0)
MCV: 92.1 fL (ref 80.0–100.0)
Monocytes Absolute: 0.7 10*3/uL (ref 0.1–1.0)
Monocytes Relative: 8 %
Neutro Abs: 5.5 10*3/uL (ref 1.7–7.7)
Neutrophils Relative %: 67 %
Platelets: 298 10*3/uL (ref 150–400)
RBC: 4.66 MIL/uL (ref 3.87–5.11)
RDW: 14 % (ref 11.5–15.5)
WBC: 8.2 10*3/uL (ref 4.0–10.5)
nRBC: 0 % (ref 0.0–0.2)

## 2023-07-05 LAB — CBG MONITORING, ED: Glucose-Capillary: 116 mg/dL — ABNORMAL HIGH (ref 70–99)

## 2023-07-05 MED ORDER — LOSARTAN POTASSIUM 50 MG PO TABS
100.0000 mg | ORAL_TABLET | Freq: Every day | ORAL | Status: DC
  Administered 2023-07-06 – 2023-07-12 (×7): 100 mg via ORAL
  Filled 2023-07-05 (×7): qty 2

## 2023-07-05 MED ORDER — ENOXAPARIN SODIUM 40 MG/0.4ML IJ SOSY
40.0000 mg | PREFILLED_SYRINGE | INTRAMUSCULAR | Status: DC
  Administered 2023-07-05 – 2023-07-11 (×7): 40 mg via SUBCUTANEOUS
  Filled 2023-07-05 (×7): qty 0.4

## 2023-07-05 MED ORDER — ACETAMINOPHEN 325 MG PO TABS
650.0000 mg | ORAL_TABLET | Freq: Four times a day (QID) | ORAL | Status: DC | PRN
  Administered 2023-07-07 – 2023-07-10 (×6): 650 mg via ORAL
  Filled 2023-07-05 (×6): qty 2

## 2023-07-05 MED ORDER — AMLODIPINE BESYLATE 10 MG PO TABS
10.0000 mg | ORAL_TABLET | Freq: Every day | ORAL | Status: DC
  Administered 2023-07-06 – 2023-07-12 (×7): 10 mg via ORAL
  Filled 2023-07-05 (×7): qty 1

## 2023-07-05 MED ORDER — ACETAMINOPHEN 650 MG RE SUPP: 650.0000 mg | Freq: Four times a day (QID) | RECTAL | Status: DC | PRN

## 2023-07-05 MED ORDER — MAGNESIUM OXIDE -MG SUPPLEMENT 400 (240 MG) MG PO TABS
800.0000 mg | ORAL_TABLET | Freq: Once | ORAL | Status: AC
  Administered 2023-07-05: 800 mg via ORAL
  Filled 2023-07-05: qty 2

## 2023-07-05 MED ORDER — HYDRALAZINE HCL 25 MG PO TABS
25.0000 mg | ORAL_TABLET | Freq: Two times a day (BID) | ORAL | Status: DC
  Administered 2023-07-05 – 2023-07-12 (×14): 25 mg via ORAL
  Filled 2023-07-05 (×14): qty 1

## 2023-07-05 MED ORDER — ONDANSETRON HCL 4 MG PO TABS: 4.0000 mg | ORAL_TABLET | Freq: Four times a day (QID) | ORAL | Status: DC | PRN

## 2023-07-05 MED ORDER — EZETIMIBE 10 MG PO TABS
10.0000 mg | ORAL_TABLET | Freq: Every day | ORAL | Status: DC
  Administered 2023-07-05 – 2023-07-12 (×8): 10 mg via ORAL
  Filled 2023-07-05 (×8): qty 1

## 2023-07-05 MED ORDER — SODIUM CHLORIDE 0.9 % IV SOLN: INTRAVENOUS | Status: DC

## 2023-07-05 MED ORDER — POTASSIUM CHLORIDE CRYS ER 20 MEQ PO TBCR
40.0000 meq | EXTENDED_RELEASE_TABLET | Freq: Once | ORAL | Status: AC
  Administered 2023-07-05: 40 meq via ORAL
  Filled 2023-07-05: qty 2

## 2023-07-05 MED ORDER — ONDANSETRON HCL 4 MG/2ML IJ SOLN: 4.0000 mg | Freq: Four times a day (QID) | INTRAMUSCULAR | Status: DC | PRN

## 2023-07-05 MED ORDER — ATORVASTATIN CALCIUM 40 MG PO TABS
40.0000 mg | ORAL_TABLET | Freq: Every day | ORAL | Status: DC
  Administered 2023-07-05 – 2023-07-12 (×8): 40 mg via ORAL
  Filled 2023-07-05 (×8): qty 1

## 2023-07-05 NOTE — ED Notes (Signed)
 Extra blue & DG drawn

## 2023-07-05 NOTE — H&P (Signed)
 History and Physical    Patient: Erika Baker:[id number] DOB: 1950-08-29 DOA: 07/05/2023 DOS: the patient was seen and examined on 07/05/2023 PCP: Omie Bickers, MD  Patient coming from: Home  Chief Complaint:  Chief Complaint  Patient presents with   Diplopia   HPI: Erika Baker is a 73 y.o. female with medical history significant of asthma, essential hypertension, hyperlipidemia, history of breast mass status postlumpectomy, history of left hip tumor presenting to the ER for evaluation of diplopia.  Symptoms started yesterday while she was driving.  Has been constant out of the right eye.  Some discomfort behind her eye.  No headaches.  Denied any weakness.  Denied any other complaint.  Patient was seen and evaluated in the ER.  Initial workup showed the diplopia.  Patient also has intracranial mass on MRI.  Patient appears to have findings consistent with lymphoma.  Patient being admitted for further evaluation and treatment.  Neurology consulted.  Review of Systems: As mentioned in the history of present illness. All other systems reviewed and are negative. Past Medical History:  Diagnosis Date   Asthma    no problems in 73yrs   Atypical ductal hyperplasia of left breast    Hyperlipidemia    Hypertension    Past Surgical History:  Procedure Laterality Date   ABDOMINAL HYSTERECTOMY     partial    BREAST LUMPECTOMY WITH RADIOACTIVE SEED LOCALIZATION Left 02/20/2020   Procedure: LEFT BREAST LUMPECTOMY WITH RADIOACTIVE SEED LOCALIZATION;  Surgeon: Dareen Ebbing, MD;  Location: [facility];  Service: General;  Laterality: Left;  LMA   COLONOSCOPY     CYSTECTOMY Right    shoulder   Social History:  reports that she has never smoked. She has never used smokeless tobacco. She reports that she does not drink alcohol and does not use drugs.  No Known Allergies  History reviewed. No pertinent family history.  Prior to Admission medications   Medication Sig  Start Date End Date Taking? Authorizing Provider  amLODipine (NORVASC) 10 MG tablet Take 10 mg by mouth daily.   Yes [provider]  hydrALAZINE (APRESOLINE) 25 MG tablet Take 25 mg by mouth in the morning and at bedtime.   Yes [provider]  losartan (COZAAR) 100 MG tablet Take 100 mg by mouth daily.   Yes [provider]  atorvastatin (LIPITOR) 10 MG tablet Take 40 mg by mouth daily.  Patient not taking: Reported on 07/05/2023    [provider]  ezetimibe (ZETIA) 10 MG tablet  06/10/23   [provider]    Physical Exam: Vitals:   07/05/23 1211 07/05/23 1223 07/05/23 1514 07/05/23 1849  BP: 138/69  135/66 (!) 144/58  Pulse: (!) 59  (!) 50 (!) 55  Resp: (!) 30  16 17   Temp: 98 F (36.7 C)  98.3 F (36.8 C) 97.8 F (36.6 C)  TempSrc:    Oral  SpO2: 100%  99% 100%  Weight:  70.3 kg    Height:  5' 2 (1.575 m)     Constitutional: Acutely ill looking, NAD, calm, comfortable Eyes: PERRL, lids and conjunctivae normal, conjugate diplopia ENMT: Mucous membranes are moist. Posterior pharynx clear of any exudate or lesions.Normal dentition.  Neck: normal, supple, no masses, no thyromegaly Respiratory: clear to auscultation bilaterally, no wheezing, no crackles. Normal respiratory effort. No accessory muscle use.  Cardiovascular: Regular rate and rhythm, no murmurs / rubs / gallops. No extremity edema. 2+ pedal pulses. No carotid  bruits.  Abdomen: no tenderness, no masses palpated. No hepatosplenomegaly. Bowel sounds positive.  Musculoskeletal: Good range of motion, no joint swelling or tenderness, Skin: no rashes, lesions, ulcers. No induration Neurologic: CN 2-12 grossly intact. Sensation intact, DTR normal. Strength 5/5 in all 4.  Psychiatric: Normal judgment and insight. Alert and oriented x 3. Normal mood  Data Reviewed:  Potassium 3.0 glucose 116 CBC all within normal.  Head CT without contrast negative MRI of the brain showed findings  consistent with possible lymphoma.  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  Could also be secondary to.  She has hip tumor.  Neurology is consulted.  No evidence of increased intracranial pressure at the moment.  Will defer to neurology for any further treatment.  Neurosurgery may be consulted as well.  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  Continue to monitor  #3 hypokalemia: Replete potassium  #4 history of asthma: No acute exacerbation.  #5 essential hypertension resume home regimen.  #6 hyperlipidemia: Continue with statin    Advance Care Planning:   Code Status: Full Code   Consults: Dr. Cleone Dad, neurologist  Family Communication: No family at bedside  Severity of Illness: The appropriate patient status for this patient is INPATIENT. Inpatient status is judged to be reasonable and necessary in order to provide the required intensity of service to ensure the patient's safety. The patient's presenting symptoms, physical exam findings, and initial radiographic and laboratory data in the context of their chronic comorbidities is felt to place them at high risk for further clinical deterioration. Furthermore, it is not anticipated that the patient will be medically stable for discharge from the hospital within 2 midnights of admission.   * I certify that at the point of admission it is my clinical judgment that the patient will require inpatient hospital care spanning beyond 2 midnights from the point of admission due to high intensity of service, high risk for further deterioration and high frequency of surveillance required.*  AuthorCarolin Chyle, MD 07/05/2023 8:38 PM  For on call review [email or web address].

## 2023-07-05 NOTE — ED Notes (Signed)
 Patient transported to MRI

## 2023-07-05 NOTE — ED Notes (Signed)
 Patient is being discharged from the Urgent Care and sent to the Emergency Department via private vehicle . Per M. Stanhope, NP, patient is in need of higher level of care due to diplopia with HA. Patient is aware and verbalizes understanding of plan of care.  Vitals:   07/05/23 1115  BP: 126/67  Pulse: (!) 58  Resp: 16  Temp: 97.8 F (36.6 C)  SpO2: 96%

## 2023-07-05 NOTE — ED Triage Notes (Signed)
 Pt reports Saturday started having right side headache. Yesterday started having double vision in right eye.

## 2023-07-05 NOTE — ED Provider Triage Note (Signed)
 Emergency Medicine Provider Triage Evaluation Note  Erika Baker , a 73 y.o. female  was evaluated in triage.  Pt complains of intermittent double vision x days  Review of Systems  Positive: Limited movement of the R eye in let EOMs Negative: Numbness, weakness, injury, Hx stroke,   Physical Exam  BP 138/69 (BP Location: Left Arm)   Pulse (!) 59   Temp 98 F (36.7 C)   Resp (!) 30   SpO2 100%  Gen:   Awake, no distress   Resp:  Normal effort  MSK:   Moves extremities without difficulty  Other:  Reduced lateral EOM of R eye, PERRL  Medical Decision Making  Medically screening exam initiated at 12:21 PM.  Appropriate orders placed.  Erika Baker was informed that the remainder of the evaluation will be completed by another provider, this initial triage assessment does not replace that evaluation, and the importance of remaining in the ED until their evaluation is complete.  Imaging and labs ordered   Erika Baker 07/05/23 1226

## 2023-07-05 NOTE — ED Triage Notes (Signed)
 Patient has right eye limited lateral EOM causing double vision since Saturday. No headache at this time, double vision is only noted when patient is looking to the right. Patient is A&Ox4 in triage.

## 2023-07-05 NOTE — ED Provider Notes (Signed)
 Patient presents to urgent care for evaluation of pain to the right eye that started 48 hours ago and diplopia to the right eye that started a little over 24 hours ago.  She was driving to church yesterday morning when she noticed double vision of the right eye.  She turned around and went home after noticing diplopia.  Diplopia has persisted into this morning and headache behind the right eye has worsened.  She wears glasses at baseline, states glasses do not improve or worsen diplopia.  Denies other visual disturbance, dizziness, changes in gait from baseline (tumor of left hip causes limp at baseline), changes in speech, facial droop, and memory changes.  She has never had a stroke in the past.  Abnormal EOM of right eye with lateral eye movement. PERRL.   Recommend further workup and evaluation in the emergency department setting to rule out acute intracranial abnormality/brain tumor given diplopia and abnormal EOMs. She is outside of the treatment window for stroke, therefore did not call code stroke. Offered [facility] transport to the ER, she declined stating she would like to have her sister bring her. Vital signs are hemodynamically stable, she is ambulatory, stable for transport to the ER via private car with sister.  Patient expresses understanding and agreement with plan to proceed directly to the emergency room.   Starlene Eaton, [place] 07/05/23 1154

## 2023-07-05 NOTE — ED Provider Notes (Signed)
 [place] [facility] Provider Note   CSN: [id number] Arrival date & time: 07/05/23  1158     History Chief Complaint  Patient presents with   Diplopia    Erika Baker is a 73 y.o. female w/ PMHx hypertension hyperlipidemia left hip tumor who presents to the ED for evaluation of diplopia.  Patient states she noticed diplopia yesterday while driving.  She states it has been constant out of her right eye.  She reports some discomfort behind her eye but denies full headache or eye pain.  She denies any trauma or injuries.       Physical Exam Updated Vital Signs BP (!) 146/68 (BP Location: Right Arm)   Pulse (!) 53   Temp 98.2 F (36.8 C) (Oral)   Resp 16   Ht 5' 2 (1.575 m)   Wt 70.3 kg   SpO2 98%   BMI 28.35 kg/m  Physical Exam Vitals and nursing note reviewed.  Constitutional:      General: She is not in acute distress.    Appearance: Normal appearance. She is well-developed.  HENT:     Head: Normocephalic and atraumatic.   Eyes:     Conjunctiva/sclera: Conjunctivae normal.     Pupils: Pupils are equal, round, and reactive to light.     Comments: Right lateral rectus palsy   Cardiovascular:     Rate and Rhythm: Bradycardia present.     Pulses: Normal pulses.  Pulmonary:     Effort: Pulmonary effort is normal.   Musculoskeletal:     Cervical back: Neck supple.     Comments: Range of motion at baseline for patient.  Large mass noted to left hip   Skin:    General: Skin is warm and dry.     Capillary Refill: Capillary refill takes less than 2 seconds.   Neurological:     Mental Status: She is alert.     Comments: Right lateral rectus palsy no other focal neurologic deficits  Psychiatric:        Mood and Affect: Mood normal.     ED Results / Procedures / Treatments   Labs (all labs ordered are listed, but only abnormal results are displayed) Labs Reviewed  BASIC METABOLIC PANEL WITH GFR - Abnormal; Notable for  the following components:      Result Value   Potassium 3.0 (*)    Glucose, Bld 116 (*)    GFR, Estimated 59 (*)    All other components within normal limits  CBG MONITORING, ED - Abnormal; Notable for the following components:   Glucose-Capillary 116 (*)    All other components within normal limits  CBC WITH DIFFERENTIAL/PLATELET  CBC    EKG None  Radiology MR BRAIN WO CONTRAST Result Date: 07/05/2023 CLINICAL DATA:  Neuro deficit, acute, stroke suspected right abducens palsy. EXAM: MRI HEAD WITHOUT CONTRAST TECHNIQUE: Multiplanar, multiecho pulse sequences of the brain and surrounding structures were obtained without intravenous contrast. COMPARISON:  Head CT 07/05/2023. FINDINGS: Brain: Marrow replacing, T2 hyperintense lesion in the clivus with associated diffusion restriction and abnormal signal extending into the right-greater-than-left cavernous sinuses (axial image 9 series 5). Given lack of bony destruction on same day CT, findings are most suspicious for an infiltrative process such as lymphoma. No acute infarct or hemorrhage. Background of mild chronic small-vessel disease. No hydrocephalus, extra-axial collection, or midline shift. Chronic microhemorrhage along the left cingulate gyrus. Vascular: Normal flow voids. Skull and upper cervical spine: As above.  Sinuses/Orbits: No acute findings. Other: None. IMPRESSION: 1. Marrow replacing, T2 hyperintense lesion in the clivus with associated diffusion restriction and abnormal signal extending into the right-greater-than-left cavernous sinuses. Given lack of bony destruction on same day CT, findings are most suspicious for an infiltrative process such as lymphoma. Recommend MRI of the brain with and without contrast for further evaluation. 2. No acute infarct or hemorrhage. Electronically Signed   By: Audra Blend M.D.   On: 07/05/2023 19:00   CT Head Wo Contrast Result Date: 07/05/2023 CLINICAL DATA:  Right side headache. Neuro  deficit, acute, stroke suspected EXAM: CT HEAD WITHOUT CONTRAST TECHNIQUE: Contiguous axial images were obtained from the base of the skull through the vertex without intravenous contrast. RADIATION DOSE REDUCTION: This exam was performed according to the departmental dose-optimization program which includes automated exposure control, adjustment of the mA and/or kV according to patient size and/or use of iterative reconstruction technique. COMPARISON:  None Available. FINDINGS: Brain: No acute intracranial abnormality. Specifically, no hemorrhage, hydrocephalus, mass lesion, acute infarction, or significant intracranial injury. Vascular: No hyperdense vessel or unexpected calcification. Skull: No acute calvarial abnormality. Sinuses/Orbits: No acute findings Other: None IMPRESSION: No acute intracranial abnormality. Electronically Signed   By: Janeece Mechanic M.D.   On: 07/05/2023 14:04    Medications Ordered in ED Medications  atorvastatin (LIPITOR) tablet 40 mg (has no administration in time range)  amLODipine (NORVASC) tablet 10 mg (has no administration in time range)  losartan (COZAAR) tablet 100 mg (has no administration in time range)  ezetimibe (ZETIA) tablet 10 mg (has no administration in time range)  hydrALAZINE (APRESOLINE) tablet 25 mg (has no administration in time range)  enoxaparin (LOVENOX) injection 40 mg (has no administration in time range)  0.9 %  sodium chloride infusion (has no administration in time range)  acetaminophen  (TYLENOL ) tablet 650 mg (has no administration in time range)    Or  acetaminophen  (TYLENOL ) suppository 650 mg (has no administration in time range)  ondansetron  (ZOFRAN ) tablet 4 mg (has no administration in time range)    Or  ondansetron  (ZOFRAN ) injection 4 mg (has no administration in time range)  magnesium oxide (MAG-OX) tablet 800 mg (800 mg Oral Given 07/05/23 1659)  potassium chloride SA (KLOR-CON M) CR tablet 40 mEq (40 mEq Oral Given 07/05/23 1659)     ED Course/ Medical Decision Making/ A&P  [name] is a 73 y.o. female presents as detailed above  Differential ddx: Intracranial abnormality, ophthalmology abnormality,  On arrival, patient afebrile hemodynamically stable no hypoxia or respiratory distress.  Patient with abducens palsy.  ED Work-up: Please see details of labs and imaging listed above. In summary, patient with mild electrolyte abnormalities electrolytes repeated.  CT head unremarkable.   Case discussed with neurology who recommends MRI brain without.   MRI brain with T2 hyperintense lesion concerning for infiltrative process such as lymphoma. Findings discussed with neurology team who recommends admission to medicine with plan to follow. While in patient neurology recommendations include but are not limited to: -additional MRI -Likely LP for flow and cytopathology (which will need to be done during business hours) -Probably CT chest/abdomen/pelvis for occult malignancy screening   Case discussed with Dr. Carlton Chick of hospitalist team who agrees with admission Patient seen with supervising physician who agrees with plan.  Final Clinical Impression(s) / ED Diagnoses Final diagnoses:  Diplopia  Cranial nerve palsy   Angele Keller, DO PGY-3 Emergency Medicine    Angele Keller, DO 07/05/23 2238  Early Glisson, MD 07/06/23 [phone]

## 2023-07-06 ENCOUNTER — Inpatient Hospital Stay (HOSPITAL_COMMUNITY)

## 2023-07-06 DIAGNOSIS — E876 Hypokalemia: Secondary | ICD-10-CM | POA: Diagnosis not present

## 2023-07-06 DIAGNOSIS — R900 Intracranial space-occupying lesion found on diagnostic imaging of central nervous system: Secondary | ICD-10-CM | POA: Diagnosis not present

## 2023-07-06 DIAGNOSIS — C782 Secondary malignant neoplasm of pleura: Secondary | ICD-10-CM | POA: Diagnosis not present

## 2023-07-06 DIAGNOSIS — H532 Diplopia: Secondary | ICD-10-CM | POA: Diagnosis not present

## 2023-07-06 DIAGNOSIS — C7802 Secondary malignant neoplasm of left lung: Secondary | ICD-10-CM | POA: Diagnosis not present

## 2023-07-06 DIAGNOSIS — C7801 Secondary malignant neoplasm of right lung: Secondary | ICD-10-CM | POA: Diagnosis not present

## 2023-07-06 DIAGNOSIS — J4520 Mild intermittent asthma, uncomplicated: Secondary | ICD-10-CM | POA: Diagnosis not present

## 2023-07-06 DIAGNOSIS — C801 Malignant (primary) neoplasm, unspecified: Secondary | ICD-10-CM | POA: Diagnosis not present

## 2023-07-06 LAB — COMPREHENSIVE METABOLIC PANEL WITH GFR
ALT: 11 U/L (ref 0–44)
AST: 18 U/L (ref 15–41)
Albumin: 3.3 g/dL — ABNORMAL LOW (ref 3.5–5.0)
Alkaline Phosphatase: 55 U/L (ref 38–126)
Anion gap: 11 (ref 5–15)
BUN: 12 mg/dL (ref 8–23)
CO2: 21 mmol/L — ABNORMAL LOW (ref 22–32)
Calcium: 9.2 mg/dL (ref 8.9–10.3)
Chloride: 105 mmol/L (ref 98–111)
Creatinine, Ser: 0.91 mg/dL (ref 0.44–1.00)
GFR, Estimated: >60 mL/min (ref >60)
Glucose, Bld: 92 mg/dL (ref 70–99)
Potassium: 3.6 mmol/L (ref 3.5–5.1)
Sodium: 137 mmol/L (ref 135–145)
Total Bilirubin: 0.9 mg/dL (ref 0.0–1.2)
Total Protein: 6.3 g/dL — ABNORMAL LOW (ref 6.5–8.1)

## 2023-07-06 LAB — CBC
HCT: 39.5 % (ref 36.0–46.0)
Hemoglobin: 13.3 g/dL (ref 12.0–15.0)
MCH: 30.6 pg (ref 26.0–34.0)
MCHC: 33.7 g/dL (ref 30.0–36.0)
MCV: 90.8 fL (ref 80.0–100.0)
Platelets: 290 10*3/uL (ref 150–400)
RBC: 4.35 MIL/uL (ref 3.87–5.11)
RDW: 14 % (ref 11.5–15.5)
WBC: 7.8 10*3/uL (ref 4.0–10.5)
nRBC: 0 % (ref 0.0–0.2)

## 2023-07-06 MED ORDER — IOHEXOL 350 MG/ML SOLN
75.0000 mL | Freq: Once | INTRAVENOUS | Status: AC | PRN
  Administered 2023-07-06: 75 mL via INTRAVENOUS

## 2023-07-06 MED ORDER — GADOBUTROL 1 MMOL/ML IV SOLN
7.0000 mL | Freq: Once | INTRAVENOUS | Status: AC | PRN
Start: 2023-07-06 — End: 2023-07-06
  Administered 2023-07-06: 7 mL via INTRAVENOUS

## 2023-07-06 NOTE — Plan of Care (Signed)

## 2023-07-06 NOTE — Consult Note (Signed)
 NEUROLOGY CONSULT NOTE   Date of service: July 06, 2023 Patient Name: Erika Baker MRN:  [id number] DOB:  July 06, 1950 Chief Complaint: Double vision Requesting Provider: Davida Espy, MD  History of Present Illness  Erika Baker is a 73 y.o. female with a past medical history significant for hypertension, hyperlipidemia, prediabetes, left hip mass concerning for sarcoma s/p biopsy, right breast mass s/p lumpectomy (atypical ductal hyperplasia).  She reports she began to have some right sided headache/discomfort on Saturday which was not positional in nature.  Subsequently on Sunday she noted some double vision while she was driving which she reports has been stable.  Worse on right gaze.  Of note she had a core biopsy completed on 6/10 through the [facility] system and is currently pending pathology.  Her labs were notable for an elevated LDH of 234 previously.  She had a follow-up scheduled with [facility] later today for completion of CT chest/abdomen/pelvis and possibly discussion of biopsy results.  I am unable to locate biopsy results in the EMR  ROS  Comprehensive ROS performed and pertinent positives documented in HPI    Past History   Past Medical History:  Diagnosis Date   Asthma    no problems in 31yrs   Atypical ductal hyperplasia of left breast    Hyperlipidemia    Hypertension     Past Surgical History:  Procedure Laterality Date   ABDOMINAL HYSTERECTOMY     partial    BREAST LUMPECTOMY WITH RADIOACTIVE SEED LOCALIZATION Left 02/20/2020   Procedure: LEFT BREAST LUMPECTOMY WITH RADIOACTIVE SEED LOCALIZATION;  Surgeon: Dareen Ebbing, MD;  Location: [facility];  Service: General;  Laterality: Left;  LMA   COLONOSCOPY     CYSTECTOMY Right    shoulder    Family History: History reviewed. No pertinent family history.  Social History  reports that she has never smoked. She has never used smokeless tobacco. She reports that she does not drink  alcohol and does not use drugs.  No Known Allergies  Medications   Current Facility-Administered Medications:    0.9 %  sodium chloride infusion, , Intravenous, Continuous, Garba, Mohammad L, MD, Last Rate: 40 mL/hr at 07/05/23 2232, New Bag at 07/05/23 2232   acetaminophen  (TYLENOL ) tablet 650 mg, 650 mg, Oral, Q6H PRN **OR** acetaminophen  (TYLENOL ) suppository 650 mg, 650 mg, Rectal, Q6H PRN, Carlton Chick, Mohammad L, MD   amLODipine (NORVASC) tablet 10 mg, 10 mg, Oral, Daily, Garba, Mohammad L, MD   atorvastatin (LIPITOR) tablet 40 mg, 40 mg, Oral, Daily, Garba, Mohammad L, MD, 40 mg at 07/05/23 2228   enoxaparin (LOVENOX) injection 40 mg, 40 mg, Subcutaneous, Q24H, Garba, Mohammad L, MD, 40 mg at 07/05/23 2228   ezetimibe (ZETIA) tablet 10 mg, 10 mg, Oral, Daily, Carlton Chick, Mohammad L, MD, 10 mg at 07/05/23 2228   hydrALAZINE (APRESOLINE) tablet 25 mg, 25 mg, Oral, BID, Garba, Mohammad L, MD, 25 mg at 07/05/23 2228   losartan (COZAAR) tablet 100 mg, 100 mg, Oral, Daily, Garba, Mohammad L, MD   ondansetron  (ZOFRAN ) tablet 4 mg, 4 mg, Oral, Q6H PRN **OR** ondansetron  (ZOFRAN ) injection 4 mg, 4 mg, Intravenous, Q6H PRN, Davida Espy, MD  Vitals   Vitals:   07/05/23 1945 07/05/23 2000 07/05/23 2045 07/05/23 2128  BP: (!) 138/57 (!) 148/134  (!) 146/68  Pulse: (!) 53 (!) 58 (!) 56 (!) 53  Resp:    16  Temp:    98.2 F (36.8 C)  TempSrc:  Oral  SpO2:  100% 100% 98%  Weight:      Height:        Body mass index is 28.35 kg/m.   Physical Exam   Constitutional: Appears well-developed and well-nourished.  Psych: Affect appropriate to situation, calm and cooperative  Eyes: No scleral injection HENT: No oropharyngeal obstruction.  MSK: no major joint deformities. Palpable lumbar spinous processes / landmarks  Cardiovascular: Normal rate and regular rhythm. Perfusing extremities well Respiratory: Effort normal, non-labored breathing GI: Soft.  No distension. There is no tenderness.   Skin: Known left hip mass   Neurologic Examination   Mental Status: Patient is awake, alert, oriented to person, place, month, year, and situation. Patient is able to give a clear and coherent history. No signs of aphasia or neglect Cranial Nerves: II: Visual Fields are full. Pupils are round and reactive to light, hard to assess symmetry due to patient frequently closing her eyes  III,IV, VI: Right eye movement limited in all directions but most prominently to right gaze. Left eye movements intact. Diplopia worst in right gaze V: Facial sensation is symmetric to temperature and light touch VII: Facial movement is symmetric.  VIII: hearing is intact to voice and symmetric on tuning fork application  X: Uvula elevates symmetrically XI: Shoulder shrug is symmetric. XII: tongue is midline without atrophy or fasciculations.  Motor: Tone is normal. Bulk is normal. 5/5 strength was present in all four extremities; mild pain limitation of left hip flexion  Sensory: Sensation is symmetric to light touch and temperature in the arms and legs. Some length dependent loss of temperature in the bilateral legs  Deep Tendon Reflexes: 2+ and symmetric in the brachioradialis and patellae, more brisk in the patellae than brachioradialis but 2+ in both Cerebellar: FNF and HKS are intact bilaterally within limits of diplopia Gait:  Deferred    Labs/Imaging/Neurodiagnostic studies   CBC:  Recent Labs  Lab 19-Jul-2023 1233  WBC 8.2  NEUTROABS 5.5  HGB 14.4  HCT 42.9  MCV 92.1  PLT 298   Basic Metabolic Panel:  Lab Results  Component Value Date   NA 137 07/19/23   K 3.0 (L) 07-19-2023   CO2 23 2023/07/19   GLUCOSE 116 (H) Jul 19, 2023   BUN 15 19-Jul-2023   CREATININE 1.00 2023-07-19   CALCIUM 10.0 19-Jul-2023   GFRNONAA 59 (L) 19-Jul-2023   GFRAA >60 06/25/2017   Lipid Panel: No results found for: LDLCALC HgbA1c: No results found for: HGBA1C Urine Drug Screen: No results found for:  LABOPIA, COCAINSCRNUR, LABBENZ, AMPHETMU, THCU, LABBARB  Alcohol Level No results found for: ETH INR No results found for: INR APTT No results found for: APTT AED levels: No results found for: PHENYTOIN, ZONISAMIDE, LAMOTRIGINE, LEVETIRACETA  CT Head without contrast(Personally reviewed): No acute intracranial abnormality.   MRI Brain(Personally reviewed): 1. Marrow replacing, T2 hyperintense lesion in the clivus with associated diffusion restriction and abnormal signal extending into the right-greater-than-left cavernous sinuses. Given lack of bony destruction on same day CT, findings are most suspicious for an infiltrative process such as lymphoma. Recommend MRI of the brain with and without contrast for further evaluation. 2. No acute infarct or hemorrhage.   ASSESSMENT   Erika Baker is a 73 y.o. 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   RECOMMENDATIONS  -  CT chest / abdomen / pelvis  - MRI brain w/ and w/o contrast  - If on review with oncology / neuro-oncology after further imaging etiology of CNS mass is unclear, may need to consider LP for flow/cytopathology in addition to basic studies  - Please note, patient was planned for oncology follow-up today 6/17 at [facility] and has requested we notify her outpatient oncologist of her hospitalization - Neurology will follow-up imaging above ______________________________________________________________________   Baldwin Levee MD-PhD [facility] [phone] Available 7 AM to 7 PM, outside these hours please contact Neurologist on call listed on AMION
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
## 2023-07-06 NOTE — Progress Notes (Signed)
 PROGRESS NOTE  Erika Baker:[id number] DOB: January 13, 1951   PCP: Omie Bickers, MD  Patient is from: Home.  Lives alone.  Independently ambulates at baseline  DOA: 07/05/2023 LOS: 1  Chief complaints Chief Complaint  Patient presents with   Diplopia     Brief Narrative / Interim history: 73 year old F with PMH of left hip tumor, breast mass s/p lumpectomy, asthma, HTN and HLD presenting with diplopia while driving the day prior to presentation and found to have intracranial mass on MRI brain.  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  MRI brain with contrast showed indistinct and enhancing infiltrative process throughout the central previous involving the sella/pituitary, smoothly involving the right throat clear well dural and questionably infiltrating the cavernous sinus.  Per radiology, differentials include lymphoma (likely favored), skull base invasive pituitary adenoma and invasive/atypical meningioma.  Neurology following.  Oncology consulted as well.  Of note, patient was seen Ortho oncology at [facility] on 6/10.  Since she has she had biopsy of left thigh tumor and MRI.  I was unable to see the results.    Subjective: Seen and examined earlier this morning.  No major events overnight or this morning.  Continues to endorse binocular diplopia which improves with covering 1 eye.  Denies headache, focal numbness, weakness or tingling.  She has been weak left eye tumor with some tenderness.  She reports that she had this tumor for about a year or two.  She says she did not find time to seek care as she has to take care of her husband who had a stroke.  Objective: Vitals:   07/05/23 2128 07/06/23 0418 07/06/23 0808 07/06/23 1149  BP: (!) 146/68 (!) 109/92 133/63 113/72  Pulse: (!) 53 72    Resp: 16 16 16 16   Temp: 98.2 F  (36.8 C) 98.4 F (36.9 C) 98.2 F (36.8 C) 98.5 F (36.9 C)  TempSrc: Oral Oral Oral Oral  SpO2: 98% 99% 97% 97%  Weight:      Height:        Examination:  GENERAL: No apparent distress.  Nontoxic. HEENT: MMM.  Vision and hearing grossly intact.  NECK: Supple.  No apparent JVD.  RESP:  No IWOB.  Fair aeration bilaterally. CVS:  RRR. Heart sounds normal.  ABD/GI/GU: BS+. Abd soft, NTND.  MSK/EXT:  Moves extremities.  Large left thigh tumor.  No significant tenderness. SKIN: no apparent skin lesion or wound NEURO: AA.  Oriented appropriately.  Clear speech.  Binocular diplopia.  Right can't go past midline laterally on EOM exam.  Motor and sensory intact. PSYCH: Calm. Normal affect.   Consultants:  Neurology Oncology  Procedures: None  Microbiology summarized: None  Assessment and plan: Binocular diplopia: Right abducens nerve palsy?  Diplopia improved with covering 1 eye.  She has limited lateral gaze in right eye.  MRI shows intracranial mass concerning for malignancy, favored to be lymphoma versus pituitary adenoma or invasive meningioma.  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 -Neurology on board -Oncology consulted  Right thigh tumor: Reports this for 1 to 2 years.  Did not seek care as she had to care for her husband who had a stroke.  Recently seen at [facility] by orthopedic oncology.  Since she had biopsy and MRI but I have no access to results. -Follow oncology recommendation  History  of asthma: Stable -Bronchodilators as needed  Essential hypertension: Normotensive -Continue home amlodipine, hydralazine and losartan.   Hypokalemia -Monitor replenish K and Mg as appropriate  Hyperlipidemia -Continue statin and Zetia Body mass index is 28.35 kg/m.          DVT prophylaxis:  enoxaparin (LOVENOX) injection 40 mg Start: 07/05/23 2200  Code Status: Full code Family Communication: None at bedside Level of care:  Med-Surg Status is: Inpatient Remains inpatient appropriate because: Intracranial mass, pulmonary mets and left thigh tumor   Final disposition: To be determined   55 minutes with more than 50% spent in reviewing records, counseling patient/family and coordinating care.   Sch Meds:  Scheduled Meds:  amLODipine  10 mg Oral Daily   atorvastatin  40 mg Oral Daily   enoxaparin (LOVENOX) injection  40 mg Subcutaneous Q24H   ezetimibe  10 mg Oral Daily   hydrALAZINE  25 mg Oral BID   losartan  100 mg Oral Daily   Continuous Infusions:  sodium chloride 40 mL/hr at 07/06/23 1036   PRN Meds:.acetaminophen  **OR** acetaminophen , ondansetron  **OR** ondansetron  (ZOFRAN ) IV  Antimicrobials: Anti-infectives (From admission, onward)    None        I have personally reviewed the following labs and images: CBC: Recent Labs  Lab 07/05/23 1233 07/06/23 0622  WBC 8.2 7.8  NEUTROABS 5.5  --   HGB 14.4 13.3  HCT 42.9 39.5  MCV 92.1 90.8  PLT 298 290   BMP &GFR Recent Labs  Lab 07/05/23 1233 07/06/23 0622  NA 137 137  K 3.0* 3.6  CL 103 105  CO2 23 21*  GLUCOSE 116* 92  BUN 15 12  CREATININE 1.00 0.91  CALCIUM 10.0 9.2   Estimated Creatinine Clearance: 49.8 mL/min (by C-G formula based on SCr of 0.91 mg/dL). Liver & Pancreas: Recent Labs  Lab 07/06/23 0622  AST 18  ALT 11  ALKPHOS 55  BILITOT 0.9  PROT 6.3*  ALBUMIN 3.3*   No results for input(s): LIPASE, AMYLASE in the last 168 hours. No results for input(s): AMMONIA in the last 168 hours. Diabetic: No results for input(s): HGBA1C in the last 72 hours. Recent Labs  Lab 07/05/23 1214  GLUCAP 116*   Cardiac Enzymes: No results for input(s): CKTOTAL, CKMB, CKMBINDEX, TROPONINI in the last 168 hours. No results for input(s): PROBNP in the last 8760 hours. Coagulation Profile: No results for input(s): INR, PROTIME in the last 168 hours. Thyroid  Function Tests: No results for input(s):  TSH, T4TOTAL, FREET4, T3FREE, THYROIDAB in the last 72 hours. Lipid Profile: No results for input(s): CHOL, HDL, LDLCALC, TRIG, CHOLHDL, LDLDIRECT in the last 72 hours. Anemia Panel: No results for input(s): VITAMINB12, FOLATE, FERRITIN, TIBC, IRON, RETICCTPCT in the last 72 hours. Urine analysis:    Component Value Date/Time   LABSPEC 1.020 06/23/2017 1320   PHURINE 6.0 06/23/2017 1320   GLUCOSEU NEGATIVE 06/23/2017 1320   HGBUR SMALL (A) 06/23/2017 1320   BILIRUBINUR SMALL (A) 06/23/2017 1320   KETONESUR NEGATIVE 06/23/2017 1320   PROTEINUR 30 (A) 06/23/2017 1320   UROBILINOGEN 0.2 06/23/2017 1320   NITRITE NEGATIVE 06/23/2017 1320   LEUKOCYTESUR SMALL (A) 06/23/2017 1320   Sepsis Labs: Invalid input(s): PROCALCITONIN, LACTICIDVEN  Microbiology: No results found for this or any previous visit (from the past 240 hours).  Radiology Studies: MR BRAIN W CONTRAST Result Date: 07/06/2023 CLINICAL DATA:  72 year old female with neurologic deficit. Right abducens palsy. Infiltrative skull base tumor suspected on noncontrast MRI and  head CT yesterday. EXAM: MRI HEAD WITH CONTRAST TECHNIQUE: Multiplanar, multiecho pulse sequences of the brain and surrounding structures were obtained with intravenous contrast. CONTRAST:  7mL GADAVIST  GADOBUTROL  1 MMOL/ML IV SOLN COMPARISON:  Noncontrast head CT and brain MRI yesterday. FINDINGS: Abnormal clivus redemonstrated with heterogeneous enhancement following contrast. The retro clival dura appears smoothly thickened and enhancing such as on series 6, image 15. No dural nodularity there. And no more widespread pachymeningeal thickening or enhancement following contrast. Bony sella turcica also appears infiltrated by this process, indistinct appearance of the pituitary with abnormal convex upper pituitary margin in this age group (series 5, image 58). Medial cavernous sinus bilaterally could also be infiltrated. No  superimposed abnormal intra-axial brain enhancement. Bilateral Meckel's cave appear to remain normal following contrast. Also, the visible nasopharynx appears within normal limits. Following contrast the other major dural venous sinuses are enhancing and appear to be patent. IMPRESSION: 1. Indistinct and enhancing infiltrative process throughout the central clivus, involving the sella/pituitary, smoothly involving the retro clival dura, and questionably infiltrating the cavernous sinuses. In conjunction with noncontrast MRI and CT findings yesterday top differential considerations are Lymphoma (slightly favored), skull base invasive pituitary adenoma, and invasive/atypical meningioma. 2. No other abnormal intracranial enhancement is identified. Electronically Signed   By: Marlise Simpers M.D.   On: 07/06/2023 12:21   CT CHEST ABDOMEN PELVIS W CONTRAST Result Date: 07/06/2023 EXAM: CT CHEST, ABDOMEN AND PELVIS WITH CONTRAST 07/06/2023 01:58:30 AM TECHNIQUE: CT of the chest, abdomen and pelvis was performed with the administration of intravenous contrast. Multiplanar reformatted images are provided for review. Automated exposure control, iterative reconstruction, and/or weight based adjustment of the mA/kV was utilized to reduce the radiation dose to as low as reasonably achievable. COMPARISON: None available. CLINICAL HISTORY: Metastatic disease evaluation; known left thigh mass w/ c/f sarcoma. FINDINGS: CHEST: MEDIASTINUM: Heart and pericardium are unremarkable. The central airways are clear. Moderate 3-vessel coronary atherosclerosis. THORACIC LYMPH NODES: No mediastinal, hilar or axillary lymphadenopathy. LUNGS AND PLEURA: Innumerable bilateral pulmonary metastases, including a dominant 3.6 cm mass in the right middle lobe (image 86). Small left pleural effusion. No pneumothorax. ABDOMEN AND PELVIS: LIVER: The liver is unremarkable. GALLBLADDER AND BILE DUCTS: Gallbladder is unremarkable. No biliary ductal dilatation.  SPLEEN: No acute abnormality. PANCREAS: No acute abnormality. ADRENAL GLANDS: No acute abnormality. KIDNEYS, URETERS AND BLADDER: 2.2 cm simple right upper pole renal cyst (image 59), benign (Bosniak 1). No follow-up is recommended. No stones in the kidneys or ureters. No hydronephrosis. No perinephric or periureteral stranding. Urinary bladder is unremarkable. GI AND BOWEL: Tiny hiatal hernia. Normal appendix (image 93). Stomach demonstrates no acute abnormality. There is no bowel obstruction. REPRODUCTIVE ORGANS: Status post hysterectomy. PERITONEUM AND RETROPERITONEUM: No ascites. No free air. VASCULATURE: Atherosclerotic calcifications of the abdominal aorta and branch vessels, although patent. Mild thoracic aortic atherosclerosis. ABDOMINAL AND PELVIS LYMPH NODES: No lymphadenopathy. REPRODUCTIVE ORGANS: No acute abnormality. BONES AND SOFT TISSUES: 14.2 x 9.5 cm left thigh mass incompletely imaged compatible with soft tissue sarcoma. Mild degenerative changes of the visualized thoracolumbar spine. IMPRESSION: 1. 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 2. Innumerable bilateral pulmonary metastases, including a dominant 3.6 cm mass in the right middle lobe. 3. Small left pleural effusion. 4. No evidence of metastatic disease in the abdomen/pelvis. Electronically signed by: Zadie Herter MD 07/06/2023 02:38 AM EDT RP Workstation: [id number]   MR BRAIN WO CONTRAST Result Date: 07/05/2023 CLINICAL DATA:  Neuro deficit, acute, stroke suspected right abducens  palsy. EXAM: MRI HEAD WITHOUT CONTRAST TECHNIQUE: Multiplanar, multiecho pulse sequences of the brain and surrounding structures were obtained without intravenous contrast. COMPARISON:  Head CT 07/05/2023. FINDINGS: Brain: Marrow replacing, T2 hyperintense lesion in the clivus with associated diffusion restriction and abnormal signal extending into the right-greater-than-left cavernous sinuses (axial image 9  series 5). Given lack of bony destruction on same day CT, findings are most suspicious for an infiltrative process such as lymphoma. No acute infarct or hemorrhage. Background of mild chronic small-vessel disease. No hydrocephalus, extra-axial collection, or midline shift. Chronic microhemorrhage along the left cingulate gyrus. Vascular: Normal flow voids. Skull and upper cervical spine: As above. Sinuses/Orbits: No acute findings. Other: None. IMPRESSION: 1. Marrow replacing, T2 hyperintense lesion in the clivus with associated diffusion restriction and abnormal signal extending into the right-greater-than-left cavernous sinuses. Given lack of bony destruction on same day CT, findings are most suspicious for an infiltrative process such as lymphoma. Recommend MRI of the brain with and without contrast for further evaluation. 2. No acute infarct or hemorrhage. Electronically Signed   By: Audra Blend M.D.   On: 07/05/2023 19:00      Abiola Behring T. Kahla Risdon [facility]  If 7PM-7AM, please contact night-coverage [email or web address] 07/06/2023, 1:51 PM

## 2023-07-06 NOTE — TOC CM/SW Note (Signed)
 Transition of Care [facility]) - Inpatient Brief Assessment   Patient Details  Name: Erika Baker MRN: [id number] Date of Birth: 28-Sep-1950  Transition of Care [facility]) CM/SW Contact:    Jennett Model, RN Phone Number: 07/06/2023, 12:51 PM   Clinical Narrative: From home alone, has PCP and insurance on file, states has no HH services in place at this time or DME at home.  States family member will transport them home at [employer] and family is support system, states gets medications from [facility] at [place]. Pta self ambulatory.   Transition of Care Asessment: Insurance and Status: Insurance coverage has been reviewed Patient has primary care physician: Yes Home environment has been reviewed: home alone Prior level of function:: indep Prior/Current Home Services: No current home services Social Drivers of Health Review: SDOH reviewed no interventions necessary Readmission risk has been reviewed: Yes Transition of care needs: no transition of care needs at this time

## 2023-07-07 ENCOUNTER — Encounter: Payer: Self-pay | Admitting: Medical Oncology

## 2023-07-07 DIAGNOSIS — R900 Intracranial space-occupying lesion found on diagnostic imaging of central nervous system: Secondary | ICD-10-CM | POA: Diagnosis not present

## 2023-07-07 DIAGNOSIS — H532 Diplopia: Secondary | ICD-10-CM

## 2023-07-07 DIAGNOSIS — I10 Essential (primary) hypertension: Secondary | ICD-10-CM | POA: Diagnosis not present

## 2023-07-07 MED ORDER — POLYETHYLENE GLYCOL 3350 17 G PO PACK
17.0000 g | PACK | Freq: Every day | ORAL | Status: DC
  Administered 2023-07-08: 17 g via ORAL
  Filled 2023-07-07 (×2): qty 1

## 2023-07-07 MED ORDER — POTASSIUM CHLORIDE CRYS ER 20 MEQ PO TBCR
40.0000 meq | EXTENDED_RELEASE_TABLET | Freq: Once | ORAL | Status: AC
  Administered 2023-07-07: 40 meq via ORAL
  Filled 2023-07-07: qty 2

## 2023-07-07 MED ORDER — SENNOSIDES-DOCUSATE SODIUM 8.6-50 MG PO TABS
2.0000 | ORAL_TABLET | Freq: Two times a day (BID) | ORAL | Status: DC
  Administered 2023-07-07 – 2023-07-12 (×8): 2 via ORAL
  Filled 2023-07-07 (×9): qty 2

## 2023-07-07 NOTE — Progress Notes (Signed)
 PROGRESS NOTE  Erika Baker:[id number] DOB: [birth year]   PCP: Omie Bickers, MD  Patient is from: Home.  Lives alone.  Independently ambulates at baseline  DOA: 07/05/2023 LOS: 2  Brief Narrative / Interim history: 73 year old F with PMH of left hip tumor, breast mass s/p lumpectomy, asthma, HTN and HLD presenting with diplopia while driving the day prior to presentation and found to have intracranial mass on MRI brain.  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  MRI brain with contrast showed indistinct and enhancing infiltrative process throughout the central previous involving the sella/pituitary, smoothly involving the right throat clear well dural and questionably infiltrating the cavernous sinus.  Per radiology, differentials include lymphoma (likely favored), skull base invasive pituitary adenoma and invasive/atypical meningioma.  Neurology following.  Oncology consulted as well. Of note, patient was seen Ortho oncology at [facility] on 6/10.  She underwent biopsy.  Pathology is pending.   Subjective: Patient sitting on the chair.  Denies any headaches per se.  Continues to have visual disturbance in the form of double vision.  Has a limp when she ambulates.  Some soreness in the left hip area.    Objective: Vitals:   07/06/23 1552 07/06/23 2103 07/07/23 0500 07/07/23 0805  BP: 137/65 115/61 112/60 133/72  Pulse: (!) 58 (!) 52 60 (!) 51  Resp: 18 16 16 16   Temp: 98.1 F (36.7 C) 98.7 F (37.1 C) 98 F (36.7 C) 98.7 F (37.1 C)  TempSrc: Oral Oral Oral Oral  SpO2: 98% 95% 99% 100%  Weight:      Height:        Examination:  General appearance: Awake alert.  In no distress Resp: Clear to auscultation bilaterally.  Normal effort Cardio: S1-S2 is normal regular.  No S3-S4.  No rubs murmurs or bruit GI: Abdomen is  soft.  Nontender nondistended.  Bowel sounds are present normal.  No masses organomegaly Extremities: No edema.  Full range of motion of lower extremities. Neurologic: Alert and oriented x3.  No focal neurological deficits.    Consultants:  Neurology Oncology  Procedures: None  Microbiology summarized: None  Assessment and plan:  Binocular diplopia This is in the setting of abnormal MRI brain findings raising concern for malignancy. Most recently seen in the oncology clinic for a mass that would identified in the left lower extremity.  Patient was referred to [facility] by cancer center.  She underwent biopsy.  Pathology is pending Medical oncology consulted.  They will reach out to [facility] to obtain pathology report which will determine further steps. Neurology is on board.    Right thigh tumor Reports this for 1 to 2 years.  Did not seek care as she had to care for her husband who had a stroke.  Recently seen at [facility] by orthopedic oncology.   She underwent biopsy.  Pathology report is pending.   PT and OT eval.  History of asthma:  Stable Bronchodilators as needed  Essential hypertension Continue home amlodipine, hydralazine and losartan. Blood pressure is reasonably well-controlled.   Hypokalemia Supplemented  Hyperlipidemia -Continue statin and Zetia  DVT prophylaxis: enoxaparin (LOVENOX) injection 40 mg Start: 07/05/23 2200 Code Status: Full code Family Communication: None at bedside Disposition: To be determined.   Sch Meds:  Scheduled Meds:  amLODipine  10 mg Oral Daily   atorvastatin  40 mg Oral Daily  enoxaparin (LOVENOX) injection  40 mg Subcutaneous Q24H   ezetimibe  10 mg Oral Daily   hydrALAZINE  25 mg Oral BID   losartan  100 mg Oral Daily   polyethylene glycol  17 g Oral Daily   senna-docusate  2 tablet Oral BID   Continuous Infusions:   PRN Meds:.acetaminophen  **OR** acetaminophen , ondansetron  **OR** ondansetron  (ZOFRAN ) IV    Lab  data: CBC: Recent Labs  Lab 07/05/23 1233 07/06/23 0622  WBC 8.2 7.8  NEUTROABS 5.5  --   HGB 14.4 13.3  HCT 42.9 39.5  MCV 92.1 90.8  PLT 298 290   BMP &GFR Recent Labs  Lab 07/05/23 1233 07/06/23 0622  NA 137 137  K 3.0* 3.6  CL 103 105  CO2 23 21*  GLUCOSE 116* 92  BUN 15 12  CREATININE 1.00 0.91  CALCIUM 10.0 9.2   Estimated Creatinine Clearance: 49.8 mL/min (by C-G formula based on SCr of 0.91 mg/dL). Liver & Pancreas: Recent Labs  Lab 07/06/23 0622  AST 18  ALT 11  ALKPHOS 55  BILITOT 0.9  PROT 6.3*  ALBUMIN 3.3*    Recent Labs  Lab 07/05/23 1214  GLUCAP 116*    Microbiology: No results found for this or any previous visit (from the past 240 hours).  Radiology Studies: MR BRAIN W CONTRAST Result Date: 07/06/2023 CLINICAL DATA:  73 year old female with neurologic deficit. Right abducens palsy. Infiltrative skull base tumor suspected on noncontrast MRI and head CT yesterday. EXAM: MRI HEAD WITH CONTRAST TECHNIQUE: Multiplanar, multiecho pulse sequences of the brain and surrounding structures were obtained with intravenous contrast. CONTRAST:  7mL GADAVIST  GADOBUTROL  1 MMOL/ML IV SOLN COMPARISON:  Noncontrast head CT and brain MRI yesterday. FINDINGS: Abnormal clivus redemonstrated with heterogeneous enhancement following contrast. The retro clival dura appears smoothly thickened and enhancing such as on series 6, image 15. No dural nodularity there. And no more widespread pachymeningeal thickening or enhancement following contrast. Bony sella turcica also appears infiltrated by this process, indistinct appearance of the pituitary with abnormal convex upper pituitary margin in this age group (series 5, image 48). Medial cavernous sinus bilaterally could also be infiltrated. No superimposed abnormal intra-axial brain enhancement. Bilateral Meckel's cave appear to remain normal following contrast. Also, the visible nasopharynx appears within normal limits.  Following contrast the other major dural venous sinuses are enhancing and appear to be patent. IMPRESSION: 1. Indistinct and enhancing infiltrative process throughout the central clivus, involving the sella/pituitary, smoothly involving the retro clival dura, and questionably infiltrating the cavernous sinuses. In conjunction with noncontrast MRI and CT findings yesterday top differential considerations are Lymphoma (slightly favored), skull base invasive pituitary adenoma, and invasive/atypical meningioma. 2. No other abnormal intracranial enhancement is identified. Electronically Signed   By: Marlise Simpers M.D.   On: 07/06/2023 12:21      Maylene Spear  [facility]  If 7PM-7AM, please contact night-coverage [email or web address] 07/07/2023, 9:33 AM

## 2023-07-07 NOTE — Progress Notes (Signed)
 [facility]  Outgoing call to Dr. Dixon Fredrickson office at [facility] to request biopsy results report. Spoke with receptionist regarding results and was informed that results are still pending. I requested for report to be faxed to me when resulted. Office thanked for their time and assistance. My fax and call back number provided.   Esperanza Hedges, RN, BSN, [facility] Nurse Navigator, [facility] 07/07/2023 10:20 AM

## 2023-07-07 NOTE — Plan of Care (Signed)
  Problem: Pain Managment: Goal: General experience of comfort will improve and/or be controlled Outcome: Progressing   Problem: Safety: Goal: Ability to remain free from injury will improve Outcome: Progressing

## 2023-07-07 NOTE — Plan of Care (Signed)
   Problem: Activity: Goal: Risk for activity intolerance will decrease Outcome: Progressing   Problem: Nutrition: Goal: Adequate nutrition will be maintained Outcome: Progressing   Problem: Pain Managment: Goal: General experience of comfort will improve and/or be controlled Outcome: Progressing   Problem: Safety: Goal: Ability to remain free from injury will improve Outcome: Progressing

## 2023-07-08 DIAGNOSIS — I10 Essential (primary) hypertension: Secondary | ICD-10-CM | POA: Diagnosis not present

## 2023-07-08 DIAGNOSIS — H532 Diplopia: Secondary | ICD-10-CM | POA: Diagnosis not present

## 2023-07-08 DIAGNOSIS — R900 Intracranial space-occupying lesion found on diagnostic imaging of central nervous system: Secondary | ICD-10-CM | POA: Diagnosis not present

## 2023-07-08 LAB — CBC
HCT: 39.3 % (ref 36.0–46.0)
Hemoglobin: 13.2 g/dL (ref 12.0–15.0)
MCH: 30.5 pg (ref 26.0–34.0)
MCHC: 33.6 g/dL (ref 30.0–36.0)
MCV: 90.8 fL (ref 80.0–100.0)
Platelets: 288 10*3/uL (ref 150–400)
RBC: 4.33 MIL/uL (ref 3.87–5.11)
RDW: 13.9 % (ref 11.5–15.5)
WBC: 7.9 10*3/uL (ref 4.0–10.5)
nRBC: 0 % (ref 0.0–0.2)

## 2023-07-08 LAB — BASIC METABOLIC PANEL WITH GFR
Anion gap: 7 (ref 5–15)
BUN: 13 mg/dL (ref 8–23)
CO2: 23 mmol/L (ref 22–32)
Calcium: 9.5 mg/dL (ref 8.9–10.3)
Chloride: 104 mmol/L (ref 98–111)
Creatinine, Ser: 0.96 mg/dL (ref 0.44–1.00)
GFR, Estimated: >60 mL/min (ref >60)
Glucose, Bld: 95 mg/dL (ref 70–99)
Potassium: 3.9 mmol/L (ref 3.5–5.1)
Sodium: 134 mmol/L — ABNORMAL LOW (ref 135–145)

## 2023-07-08 LAB — MAGNESIUM: Magnesium: 2.1 mg/dL (ref 1.7–2.4)

## 2023-07-08 NOTE — Care Management Important Message (Signed)
 Important Message  Patient Details  Name: Erika Baker MRN: [id number] Date of Birth: 1950-04-11   Important Message Given:  Yes - Medicare IM     Felix Host 07/08/2023, 3:19 PM

## 2023-07-08 NOTE — Plan of Care (Signed)

## 2023-07-08 NOTE — Evaluation (Signed)
 Physical Therapy Evaluation Patient Details Name: Erika Baker MRN: [id number] DOB: 07-30-1950 Today's Date: 07/08/2023  History of Present Illness  Erika Baker is a 73 y.o. female with medical history significant of asthma, essential hypertension, hyperlipidemia, history of breast mass status postlumpectomy, history of left hip tumor presenting to the ER for evaluation of diplopia.  Symptoms started yesterday while she was driving.  Has been constant out of the right eye.  Some discomfort behind her eye.  No headaches.  Denied any weakness.  Denied any other complaint.  Patient was seen and evaluated in the ER.  Initial workup showed the diplopia.  Patient also has intracranial mass on MRI.  Patient appears to have findings consistent with lymphoma.  Patient being admitted for further evaluation and treatment.  Neurology consulted.   Clinical Impression  Erika Baker is 73 y.o. female admitted with above HPI and diagnosis. Patient is currently limited by functional impairments below (see PT problem list). Patient lives alone and is independent with no AD at baseline. Currently pt is mobilizing at supervision to CGA level for transfers and gait with no AD. Pt mildly unsteady due to antalgic gait with Lt hip pain. Educated pt on benefits of SPC or RW for support to reduce discomfort and improve stability/balance. Pt amb ~200' and agreeable to remain OOB at EOS. Patient will benefit from continued skilled PT interventions to address impairments and progress independence with mobility. Acute PT will follow and progress as able.         If plan is discharge home, recommend the following:     Can travel by private vehicle        Equipment Recommendations None recommended by PT  Recommendations for Other Services       Functional Status Assessment Patient has had a recent decline in their functional status and demonstrates the ability to make significant improvements in function in  a reasonable and predictable amount of time.     Precautions / Restrictions Precautions Precautions: Fall Restrictions Weight Bearing Restrictions Per Provider Order: No      Mobility  Bed Mobility               General bed mobility comments: OOB in recliner    Transfers Overall transfer level: Needs assistance Equipment used: None Transfers: Sit to/from Stand Sit to Stand: Supervision           General transfer comment: sup for safety with power up    Ambulation/Gait Ambulation/Gait assistance: Contact guard assist Gait Distance (Feet): 200 Feet Assistive device: None Gait Pattern/deviations: Step-through pattern, Decreased stride length, Shuffle Gait velocity: fair/decr     General Gait Details: antalgic gait with decreased stance time and weight shift to Lt due to hip pain (pt describes as a bruise). no overt LOB and steady pace throughout.  Stairs            Wheelchair Mobility     Tilt Bed    Modified Rankin (Stroke Patients Only)       Balance Overall balance assessment: Needs assistance Sitting-balance support: No upper extremity supported Sitting balance-Leahy Scale: Good     Standing balance support: No upper extremity supported, During functional activity Standing balance-Leahy Scale: Fair                               Pertinent Vitals/Pain Pain Assessment Pain Assessment: Faces Faces Pain Scale: Hurts a little bit Pain Location:  HA above R eye Pain Descriptors / Indicators: Aching, Dull Pain Intervention(s): Limited activity within patient's tolerance, Monitored during session, Repositioned    Home Living Family/patient expects to be discharged to:: Private residence Living Arrangements: Alone Available Help at Discharge: Family Type of Home: House Home Access: Stairs to enter   Secretary/administrator of Steps: 1-2   Home Layout: One level Home Equipment: None      Prior Function Prior Level of  Function : Independent/Modified Independent;Driving             Mobility Comments: used no AD for walking, but reports that she has walked with a limp in L LE for ~ 1 year (tumor on L thigh) ADLs Comments: Ind with ADLs, IADLs, home mgt, cooking. shopping     Extremity/Trunk Assessment   Upper Extremity Assessment Upper Extremity Assessment: Defer to OT evaluation    Lower Extremity Assessment Lower Extremity Assessment: Overall WFL for tasks assessed;LLE deficits/detail LLE Deficits / Details: hx of Lt hip pain. CT on 05/21/23: heterogeneously enhancing soft tissue mass  within the quadriceps musculature with cystic areas likely  reflecting areas of necrosis. 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    Cervical / Trunk Assessment Cervical / Trunk Assessment: Normal  Communication   Communication Communication: No apparent difficulties    Cognition Arousal: Alert Behavior During Therapy: WFL for tasks assessed/performed   PT - Cognitive impairments: No family/caregiver present to determine baseline, No apparent impairments                       PT - Cognition Comments: pleasnat and cooperative Following commands: Intact       Cueing Cueing Techniques: Verbal cues     General Comments      Exercises     Assessment/Plan    PT Assessment Patient needs continued PT services  PT Problem List Decreased strength;Decreased knowledge of precautions;Decreased safety awareness;Decreased knowledge of use of DME;Decreased mobility;Decreased balance;Decreased activity tolerance;Decreased range of motion       PT Treatment Interventions DME instruction;Gait training;Stair training;Functional mobility training;Therapeutic activities;Therapeutic exercise;Balance training;Patient/family education    PT Goals (Current goals can be found in the Care Plan section)  Acute Rehab PT Goals Patient Stated Goal: figure out treatment plan PT Goal Formulation: With  patient Time For Goal Achievement: 07/22/23 Potential to Achieve Goals: Fair    Frequency Min 2X/week     Co-evaluation               AM-PAC PT 6 Clicks Mobility  Outcome Measure Help needed turning from your back to your side while in a flat bed without using bedrails?: None Help needed moving from lying on your back to sitting on the side of a flat bed without using bedrails?: None Help needed moving to and from a bed to a chair (including a wheelchair)?: A Little Help needed standing up from a chair using your arms (e.g., wheelchair or bedside chair)?: A Little Help needed to walk in hospital room?: A Little Help needed climbing 3-5 steps with a railing? : A Little 6 Click Score: 20    End of Session Equipment Utilized During Treatment: Gait belt Activity Tolerance: Patient tolerated treatment well Patient left: in chair;with call bell/phone within reach Nurse Communication: Mobility status PT Visit Diagnosis: Unsteadiness on feet (R26.81);Other abnormalities of gait and mobility (R26.89);Muscle weakness (generalized) (M62.81);Difficulty in walking, not elsewhere classified (R26.2);Pain Pain - Right/Left: Left Pain - part of body: Hip  Time: 7829-5621 PT Time Calculation (min) (ACUTE ONLY): 16 min   Charges:   PT Evaluation $PT Eval Moderate Complexity: 1 Mod   PT General Charges $$ ACUTE PT VISIT: 1 Visit         Tish Forge, DPT Acute Rehabilitation Services Office [phone]  07/08/23 3:33 PM

## 2023-07-08 NOTE — Evaluation (Signed)
 Occupational Therapy Evaluation Patient Details Name: Erika Baker MRN: [id number] DOB: 12/02/1950 Today's Date: 07/08/2023   History of Present Illness   Erika Baker is a 73 y.o. female with medical history significant of asthma, essential hypertension, hyperlipidemia, history of breast mass status postlumpectomy, history of left hip tumor presenting to the ER for evaluation of diplopia.  Symptoms started yesterday while she was driving.  Has been constant out of the right eye.  Some discomfort behind her eye.  No headaches.  Denied any weakness.  Denied any other complaint.  Patient was seen and evaluated in the ER.  Initial workup showed the diplopia.  Patient also has intracranial mass on MRI.  Patient appears to have findings consistent with lymphoma.  Patient being admitted for further evaluation and treatment.  Neurology consulted.     Clinical Impressions Pt presents with decline in function and safety with ADLs and ADL mobility with impaired balance and vision in R eye (blurred and double per pt report). PTA pt lived alone and was Ind with ADLs, IADLs, drives, home mgt, cooking and grocery shopping. Pt reports that he sister can assist at home prn. Pt currently requires min A with LB ADLs for safety due to impaired balance, CGA/Sup with grooming and UB ADLs due to R eye visual impairments and CGA with mobility HHA with walking to bathroom, toilet transfers and standing at sink for ADL tasks. Pt with unsteady, guarded gait. Pt would benefit from acute OT services to address impairments to maximize level of function and safety     If plan is discharge home, recommend the following:   A little help with walking and/or transfers;A little help with bathing/dressing/bathroom;Assistance with cooking/housework;Assist for transportation;Help with stairs or ramp for entrance     Functional Status Assessment   Patient has had a recent decline in their functional status and  demonstrates the ability to make significant improvements in function in a reasonable and predictable amount of time.     Equipment Recommendations   Tub/shower seat     Recommendations for Other Services         Precautions/Restrictions   Precautions Precautions: Fall Restrictions Weight Bearing Restrictions Per Provider Order: No     Mobility Bed Mobility Overal bed mobility: Modified Independent                  Transfers Overall transfer level: Needs assistance Equipment used: 1 person hand held assist Transfers: Sit to/from Stand, Bed to chair/wheelchair/BSC Sit to Stand: Contact guard assist           General transfer comment: cues for safety, unsteady      Balance Overall balance assessment: Needs assistance Sitting-balance support: No upper extremity supported, Feet supported Sitting balance-Leahy Scale: Good     Standing balance support: Single extremity supported, During functional activity Standing balance-Leahy Scale: Poor                             ADL either performed or assessed with clinical judgement   ADL Overall ADL's : Needs assistance/impaired Eating/Feeding: Set up;Independent Eating/Feeding Details (indicate cue type and reason): R eye visual impairments Grooming: Wash/dry hands;Wash/dry face;Oral care;Contact guard assist;Standing   Upper Body Bathing: Supervision/ safety   Lower Body Bathing: Minimal assistance;Sit to/from stand;Cueing for safety Lower Body Bathing Details (indicate cue type and reason): for safety due to impaired balance Upper Body Dressing : Supervision/safety   Lower Body Dressing: Minimal assistance;Sit  to/from stand;Cueing for safety Lower Body Dressing Details (indicate cue type and reason): for safety due to impaired balance Toilet Transfer: Contact guard assist;Ambulation;Regular Toilet;Grab bars;Cueing for safety   Toileting- Clothing Manipulation and Hygiene: Contact guard  assist;Sit to/from stand       Functional mobility during ADLs: Contact guard assist;Cueing for safety General ADL Comments: walked with pt HHA to bathroom, pt stood at siink for grooming/hygiene tasks CGA     Vision Baseline Vision/History: 1 Wears glasses Ability to See in Adequate Light: 0 Adequate Patient Visual Report: Diplopia;Blurring of vision Additional Comments: pt reports vision on R eye fluctuating from blurred to double vision. Pt overshooting during oral care tasks to apply toothpaste to toothbrush     Perception         Praxis         Pertinent Vitals/Pain Pain Assessment Pain Assessment: 0-10 Pain Score: 5  Pain Location: HA above R eye Pain Descriptors / Indicators: Aching, Dull Pain Intervention(s): Monitored during session     Extremity/Trunk Assessment Upper Extremity Assessment Upper Extremity Assessment: Overall WFL for tasks assessed   Lower Extremity Assessment Lower Extremity Assessment: Defer to PT evaluation       Communication Communication Communication: No apparent difficulties   Cognition Arousal: Alert Behavior During Therapy: WFL for tasks assessed/performed Cognition: No apparent impairments             OT - Cognition Comments: verbal cues for safety during mobility                 Following commands: Intact       Cueing  General Comments   Cueing Techniques: Verbal cues      Exercises     Shoulder Instructions      Home Living Family/patient expects to be discharged to:: Private residence Living Arrangements: Alone Available Help at Discharge: Family Type of Home: House Home Access: Stairs to enter [employer] of Steps: 1-2   Home Layout: One level     Bathroom Shower/Tub: Tub/shower unit;Walk-in shower   Bathroom Toilet: Handicapped height     Home Equipment: None          Prior Functioning/Environment Prior Level of Function : Independent/Modified Independent;Driving              Mobility Comments: used no AD for walking, but reports that she has walked with a limp in L LE for ~ 1 year (tumor on L thigh) ADLs Comments: Ind with ADLs, IADLs, home mgt, cooking. shopping    OT Problem List: Impaired vision/perception;Decreased coordination;Impaired balance (sitting and/or standing)   OT Treatment/Interventions: Self-care/ADL training;Visual/perceptual remediation/compensation;Neuromuscular education;Therapeutic activities      OT Goals(Current goals can be found in the care plan section)   Acute Rehab OT Goals Patient Stated Goal: go home OT Goal Formulation: With patient Time For Goal Achievement: 07/22/23 Potential to Achieve Goals: Good ADL Goals Pt Will Perform Grooming: with supervision;with set-up;with modified independence;standing Pt Will Perform Lower Body Bathing: with contact guard assist;with supervision;sit to/from stand Pt Will Perform Lower Body Dressing: with contact guard assist;with supervision;sit to/from stand Pt Will Transfer to Toilet: with supervision;with modified independence;ambulating;regular height toilet;grab bars Pt Will Perform Toileting - Clothing Manipulation and hygiene: with supervision;with modified independence;sit to/from stand Pt Will Perform Tub/Shower Transfer: with supervision;with modified independence;ambulating;shower seat;grab bars Additional ADL Goal #1: pt will use compenstory visual techniques during ADLs and ADL mobility to improve safety/Ind and decrease risk for falls   OT Frequency:  Min 2X/week  Co-evaluation              AM-PAC OT 6 Clicks Daily Activity     Outcome Measure Help from another person eating meals?: None Help from another person taking care of personal grooming?: A Little Help from another person toileting, which includes using toliet, bedpan, or urinal?: A Little Help from another person bathing (including washing, rinsing, drying)?: A Little Help from another person  to put on and taking off regular upper body clothing?: A Little Help from another person to put on and taking off regular lower body clothing?: A Little 6 Click Score: 19   End of Session Equipment Utilized During Treatment: Gait belt Nurse Communication: Mobility status  Activity Tolerance: Patient tolerated treatment well Patient left: in chair;with call bell/phone within reach;with chair alarm set  OT Visit Diagnosis: Other abnormalities of gait and mobility (R26.89);Other (comment) (impaired vision R eye)                Time: 9629-5284 OT Time Calculation (min): 24 min Charges:  OT General Charges $OT Visit: 1 Visit OT Evaluation $OT Eval Low Complexity: 1 Low OT Treatments $Self Care/Home Management : 8-22 mins    [name] 07/08/2023, 1:57 PM

## 2023-07-08 NOTE — Progress Notes (Signed)
 PROGRESS NOTE  Erika Baker:[id number] DOB: 1950/06/01   PCP: Omie Bickers, MD  Patient is from: Home.  Lives alone.  Independently ambulates at baseline  DOA: 07/05/2023 LOS: 3  Brief Narrative / Interim history: 73 year old F with PMH of left hip tumor, breast mass s/p lumpectomy, asthma, HTN and HLD presenting with diplopia while driving the day prior to presentation and found to have intracranial mass on MRI brain.  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  MRI brain with contrast showed indistinct and enhancing infiltrative process throughout the central previous involving the sella/pituitary, smoothly involving the right throat clear well dural and questionably infiltrating the cavernous sinus.  Per radiology, differentials include lymphoma (likely favored), skull base invasive pituitary adenoma and invasive/atypical meningioma.  Neurology following.  Oncology consulted as well. Of note, patient was seen Ortho oncology at [facility] on 6/10.  She underwent biopsy.  Pathology is pending.   Subjective: Patient lying on the bed.  Denies any new complaints.  Continues to have double vision.  Still with discomfort to the left lower extremity.    Objective: Vitals:   07/07/23 1927 07/08/23 0431 07/08/23 0829 07/08/23 0858  BP: 129/64 133/60 136/82 (!) 152/71  Pulse:  (!) 51  (!) 51  Resp:    18  Temp: 98.3 F (36.8 C) 98.6 F (37 C)  98.4 F (36.9 C)  TempSrc: Oral Oral  Oral  SpO2: 100% 100%  99%  Weight:      Height:        Examination:  General appearance: Awake alert.  In no distress Resp: Clear to auscultation bilaterally.  Normal effort Cardio: S1-S2 is normal regular.  No S3-S4.  No rubs murmurs or bruit GI: Abdomen is soft.  Nontender nondistended.  Bowel sounds are present normal.  No masses  organomegaly eurologic: Alert and oriented x3.  No focal neurological deficits.    Consultants:  Neurology Oncology  Procedures: None  Microbiology summarized: None  Assessment and plan:  Binocular diplopia This is in the setting of abnormal MRI brain findings raising concern for malignancy. Most recently seen in the oncology clinic for a mass that would identified in the left lower extremity.  Patient was referred to [facility] by cancer center.  She underwent biopsy.  Pathology is pending Medical oncology consulted.  They will reach out to [facility] to obtain pathology report which will determine further steps. Neurology is on board.    Right thigh tumor Reports this for 1 to 2 years.  Did not seek care as she had to care for her husband who had a stroke.   Recently seen at [facility] by orthopedic oncology.   She underwent biopsy.  Pathology report is pending.   PT and OT eval.  History of asthma:  Stable Bronchodilators as needed  Essential hypertension Continue home amlodipine, hydralazine and losartan. Blood pressure is reasonably well-controlled.   Hypokalemia Supplemented  Hyperlipidemia Continue statin and Zetia  DVT prophylaxis: enoxaparin (LOVENOX) injection 40 mg Start: 07/05/23 2200 Code Status: Full code Family Communication: None at bedside Disposition: To be determined.  PT and OT eval is pending.   Sch Meds:  Scheduled Meds:  amLODipine  10 mg Oral Daily   atorvastatin  40 mg Oral Daily   enoxaparin (LOVENOX) injection  40 mg Subcutaneous Q24H   ezetimibe  10 mg Oral Daily   hydrALAZINE  25 mg Oral BID   losartan  100 mg Oral Daily   polyethylene glycol  17 g Oral Daily   senna-docusate  2 tablet Oral BID   Continuous Infusions:   PRN Meds:.acetaminophen  **OR** acetaminophen , ondansetron  **OR** ondansetron  (ZOFRAN ) IV    Lab data: CBC: Recent Labs  Lab 07/05/23 1233 07/06/23 0622 07/08/23 0616  WBC 8.2 7.8 7.9  NEUTROABS 5.5  --    --   HGB 14.4 13.3 13.2  HCT 42.9 39.5 39.3  MCV 92.1 90.8 90.8  PLT 298 290 288   BMP &GFR Recent Labs  Lab 07/05/23 1233 07/06/23 0622 07/08/23 0616  NA 137 137 134*  K 3.0* 3.6 3.9  CL 103 105 104  CO2 23 21* 23  GLUCOSE 116* 92 95  BUN 15 12 13   CREATININE 1.00 0.91 0.96  CALCIUM 10.0 9.2 9.5  MG  --   --  2.1   Estimated Creatinine Clearance: 47.2 mL/min (by C-G formula based on SCr of 0.96 mg/dL). Liver & Pancreas: Recent Labs  Lab 07/06/23 0622  AST 18  ALT 11  ALKPHOS 55  BILITOT 0.9  PROT 6.3*  ALBUMIN 3.3*    Recent Labs  Lab 07/05/23 1214  GLUCAP 116*    Microbiology: No results found for this or any previous visit (from the past 240 hours).  Radiology Studies: No results found.   Jacqueleen Pulver  [facility]  If 7PM-7AM, please contact night-coverage [email or web address] 07/08/2023, 10:02 AM

## 2023-07-09 DIAGNOSIS — M899 Disorder of bone, unspecified: Secondary | ICD-10-CM

## 2023-07-09 DIAGNOSIS — J4520 Mild intermittent asthma, uncomplicated: Secondary | ICD-10-CM | POA: Diagnosis not present

## 2023-07-09 DIAGNOSIS — R900 Intracranial space-occupying lesion found on diagnostic imaging of central nervous system: Secondary | ICD-10-CM | POA: Diagnosis not present

## 2023-07-09 DIAGNOSIS — E876 Hypokalemia: Secondary | ICD-10-CM | POA: Diagnosis not present

## 2023-07-09 DIAGNOSIS — H532 Diplopia: Secondary | ICD-10-CM | POA: Diagnosis not present

## 2023-07-09 NOTE — Plan of Care (Signed)
  Problem: Education: Goal: Knowledge of General Education information will improve Description: Including pain rating scale, medication(s)/side effects and non-pharmacologic comfort measures Outcome: Progressing   Problem: Clinical Measurements: Goal: Diagnostic test results will improve Outcome: Progressing   Problem: Pain Managment: Goal: General experience of comfort will improve and/or be controlled Outcome: Progressing   Problem: Safety: Goal: Ability to remain free from injury will improve Outcome: Progressing

## 2023-07-09 NOTE — Progress Notes (Signed)
 Occupational Therapy Treatment Patient Details Name: Erika Baker MRN: [id number] DOB: 05/07/1950 Today's Date: 07/09/2023   History of present illness RETA Baker is a 73 y.o. female with medical history significant of asthma, essential hypertension, hyperlipidemia, history of breast mass status postlumpectomy, history of left hip tumor presenting to the ER for evaluation of diplopia.  Symptoms started yesterday while she was driving.  Has been constant out of the right eye.  Some discomfort behind her eye.  No headaches.  Denied any weakness.  Denied any other complaint.  Patient was seen and evaluated in the ER.  Initial workup showed the diplopia.  Patient also has intracranial mass on MRI.  Patient appears to have findings consistent with lymphoma.  Patient being admitted for further evaluation and treatment.  Neurology consulted.   OT comments  Pt making good progress with functional goals. Pt seated in chair with visitor upon arrival. Pt walked to bathroom and around room to gather items without RW, pt with no limping or c/o of R eye or LE pain, no LOB or unsteadiness during ADL mobility. OT will continue to follow acutely to maximize level of function and safety       If plan is discharge home, recommend the following:  A little help with walking and/or transfers;A little help with bathing/dressing/bathroom;Assistance with cooking/housework;Assist for transportation;Help with stairs or ramp for entrance   Equipment Recommendations  Tub/shower seat    Recommendations for Other Services      Precautions / Restrictions Precautions Precautions: Fall Restrictions Weight Bearing Restrictions Per Provider Order: No       Mobility Bed Mobility               General bed mobility comments: OOB in recliner    Transfers Overall transfer level: Needs assistance Equipment used: None Transfers: Sit to/from Stand Sit to Stand: Supervision                  Balance Overall balance assessment: Needs assistance Sitting-balance support: No upper extremity supported Sitting balance-Leahy Scale: Good     Standing balance support: No upper extremity supported, During functional activity Standing balance-Leahy Scale: Fair                             ADL either performed or assessed with clinical judgement   ADL Overall ADL's : Needs assistance/impaired     Grooming: Wash/dry hands;Wash/dry face;Oral care;Supervision/safety;Standing       Lower Body Bathing: Contact guard assist;Supervison/ safety;Sit to/from stand       Lower Body Dressing: Contact guard assist;Supervision/safety;Sit to/from stand   Toilet Transfer: Supervision/safety;Ambulation   Toileting- Clothing Manipulation and Hygiene: Supervision/safety;Sit to/from stand       Functional mobility during ADLs: Supervision/safety General ADL Comments: pt with no limping or c/o of R eye or LE pain, no LOB or unsteadiness during ADL mobility    Extremity/Trunk Assessment Upper Extremity Assessment Upper Extremity Assessment: Overall WFL for tasks assessed            Vision Baseline Vision/History: 1 Wears glasses Ability to See in Adequate Light: 0 Adequate Patient Visual Report: Blurring of vision Additional Comments: pt reports bulrry vison has imiproved. Pt has eye glasses with R side taped   Perception     Praxis     Communication Communication Communication: No apparent difficulties   Cognition Arousal: Alert Behavior During Therapy: [employer] for tasks assessed/performed  Following commands: Intact        Cueing   Cueing Techniques: Verbal cues  Exercises      Shoulder Instructions       General Comments      Pertinent Vitals/ Pain       Pain Assessment Pain Assessment: No/denies pain Pain Score: 0-No pain  Home Living                                          Prior  Functioning/Environment              Frequency  Min 2X/week        Progress Toward Goals  OT Goals(current goals can now be found in the care plan section)  Progress towards OT goals: Progressing toward goals     Plan      Co-evaluation                 AM-PAC OT 6 Clicks Daily Activity     Outcome Measure   Help from another person eating meals?: None Help from another person taking care of personal grooming?: A Little Help from another person toileting, which includes using toliet, bedpan, or urinal?: A Little Help from another person bathing (including washing, rinsing, drying)?: A Little Help from another person to put on and taking off regular upper body clothing?: A Little Help from another person to put on and taking off regular lower body clothing?: A Little 6 Click Score: 19    End of Session Equipment Utilized During Treatment: Gait belt  OT Visit Diagnosis: Other abnormalities of gait and mobility (R26.89);Other (comment)   Activity Tolerance Patient tolerated treatment well   Patient Left in chair;with call bell/phone within reach;with chair alarm set   Nurse Communication Mobility status        Time: 1435-1451 OT Time Calculation (min): 16 min  Charges: OT General Charges $OT Visit: 1 Visit OT Treatments $Self Care/Home Management : 8-22 mins   [name] 07/09/2023, 3:07 PM

## 2023-07-09 NOTE — Progress Notes (Signed)
 NEUROLOGY CONSULT FOLLOW UP NOTE   Date of service: July 09, 2023 Patient Name: Erika Baker MRN:  [id number] DOB:  March 15, 1950  Interval Hx/subjective  Patient has been hemodynamically stable and afebrile overnight.  She continues to have right ptosis and impaired ocular movements especially in her right eye.  This appears to be progressing. Vitals   Vitals:   07/08/23 2108 07/08/23 2144 07/09/23 0428 07/09/23 0742  BP: (!) 139/90 (!) 139/90 (!) 136/59 133/66  Pulse: (!) 55   (!) 50  Resp:    16  Temp: 98.3 F (36.8 C)  98.1 F (36.7 C) 98.4 F (36.9 C)  TempSrc: Oral  Oral Oral  SpO2: 99%  99% 100%  Weight:      Height:         Body mass index is 28.35 kg/m.  Physical Exam   Constitutional: Appears well-developed and well-nourished.  Psych: Affect appropriate to situation.  Eyes: No scleral injection.  HENT: No OP obstrucion.  Head: Normocephalic.  Respiratory: Effort normal, non-labored breathing.  Skin: WDI.   Neurologic Examination    NEURO:  Mental Status: AA&Ox3, able to give clear and coherent history of present illness Speech/Language: speech is without dysarthria or aphasia.    Cranial Nerves:  II: PERRL.  III, IV, VI: Right-sided ptosis.  Impaired upgaze in bilateral eyes, downgaze intact, and first on exam, patient was unable to abduct and adduct her right eye but had intact adduction of left eye.  Later with more effort, she was able to abduct and adduct bilateral eyes V: Sensation is intact to light touch and symmetrical to face.  VII: Smile is symmetrical.  VIII: hearing intact to voice. IX, X: Phonation is normal.  XII: tongue is midline without fasciculations. Motor: Able to move all 4 extremities with good antigravity strength Tone: is normal and bulk is normal Sensation- Intact to light touch bilaterally.   Coordination: FTN intact bilaterally Gait- deferred   Medications  Current Facility-Administered Medications:    acetaminophen   (TYLENOL ) tablet 650 mg, 650 mg, Oral, Q6H PRN, 650 mg at 07/09/23 0838 **OR** acetaminophen  (TYLENOL ) suppository 650 mg, 650 mg, Rectal, Q6H PRN, Carlton Chick, Mohammad L, MD   amLODipine (NORVASC) tablet 10 mg, 10 mg, Oral, Daily, Garba, Mohammad L, MD, 10 mg at 07/09/23 4098   atorvastatin (LIPITOR) tablet 40 mg, 40 mg, Oral, Daily, Garba, Mohammad L, MD, 40 mg at 07/09/23 0827   enoxaparin (LOVENOX) injection 40 mg, 40 mg, Subcutaneous, Q24H, Garba, Mohammad L, MD, 40 mg at 07/08/23 2144   ezetimibe (ZETIA) tablet 10 mg, 10 mg, Oral, Daily, Garba, Mohammad L, MD, 10 mg at 07/09/23 1191   hydrALAZINE (APRESOLINE) tablet 25 mg, 25 mg, Oral, BID, Garba, Mohammad L, MD, 25 mg at 07/09/23 0828   losartan (COZAAR) tablet 100 mg, 100 mg, Oral, Daily, Garba, Mohammad L, MD, 100 mg at 07/09/23 0827   ondansetron  (ZOFRAN ) tablet 4 mg, 4 mg, Oral, Q6H PRN **OR** ondansetron  (ZOFRAN ) injection 4 mg, 4 mg, Intravenous, Q6H PRN, Garba, Mohammad L, MD   polyethylene glycol (MIRALAX / GLYCOLAX) packet 17 g, 17 g, Oral, Daily, Krishnan, Gokul, MD, 17 g at 07/08/23 4782   senna-docusate (Senokot-S) tablet 2 tablet, 2 tablet, Oral, BID, Maylene Spear, MD, 2 tablet at 07/09/23 0828  Labs and Diagnostic Imaging   CBC:  Recent Labs  Lab 07/05/23 1233 07/06/23 0622 07/08/23 0616  WBC 8.2 7.8 7.9  NEUTROABS 5.5  --   --   HGB 14.4 13.3  13.2  HCT 42.9 39.5 39.3  MCV 92.1 90.8 90.8  PLT 298 290 288    Basic Metabolic Panel:  Lab Results  Component Value Date   NA 134 (L) 07/08/2023   K 3.9 07/08/2023   CO2 23 07/08/2023   GLUCOSE 95 07/08/2023   BUN 13 07/08/2023   CREATININE 0.96 07/08/2023   CALCIUM 9.5 07/08/2023   GFRNONAA >60 07/08/2023   GFRAA >60 06/25/2017   Lipid Panel: No results found for: LDLCALC HgbA1c: No results found for: HGBA1C Urine Drug Screen: No results found for: LABOPIA, COCAINSCRNUR, LABBENZ, AMPHETMU, THCU, LABBARB  Alcohol Level No results found for:  ETH INR No results found for: INR APTT No results found for: APTT AED levels: No results found for: PHENYTOIN, ZONISAMIDE, LAMOTRIGINE, LEVETIRACETA  CT Head without contrast(Personally reviewed): No acute abnormality  CT chest abdomen and pelvis: 14.2 cm left thigh mass, compatible with soft tissue sarcoma, innumerable bilateral pulmonary metastases, small left pleural effusion  MRI Brain with and without contrast (Personally reviewed): Marrow replacing, T2 hyperintense lesion in the clivus with associated diffusion restriction and abnormal signal extending into the cavernous sinuses, uncommon postcontrast sequence, indistinct enhancing infiltrative process throughout the central clivus, involving the sella, pituitary and retroclival dura and questionably infiltrating cavernous sinuses differential includes lymphoma, skull base invasive pituitary adenoma and invasive or atypical meningioma   Assessment   Erika Baker is a 73 y.o. female with history of hypertension, hyperlipidemia, prediabetes and left hip mass concerning for sarcoma awaiting biopsy and right breast mass status postlumpectomy who presented with right-sided headache and diplopia.  Patient did have a biopsy of her left hip lesion in the [facility] system and is awaiting results.  On MRI, she was found to have a T2 hyperintense lesion in the clivus which may be a metastasis of the left hip lesion or a second primary tumor.  Metastases were also seen on CT chest abdomen and pelvis.  Lumbar puncture would be beneficial to patient to further evaluate lesion in the clivus, especially as extraocular movement deficits appear to be progressing.  The question remains as to whether it would be more beneficial for patient to undergo lumbar puncture here or to finish this workup in the [facility] system as she is awaiting biopsy results from biopsy performed there.  Will discuss this with oncology.  Recommendations  - suspect that  the noted T2 hyperintense lesion in the clivus is likely a met of the left hip lesion or a low likelihood but possibly could be a second primary tumor. Does have multiple pulm mets. Waiting on primary biopsy results but also considering LP. If she is better served in a tertiary academic center, then perhaps getting LP there with CSF flow and cytology would be a better option. ______________________________________________________________________  Patient seen by NP with MD, MD to edit note as needed.  Signed, Cortney E Bucky Cardinal, NP [facility]    NEUROHOSPITALIST ADDENDUM Performed a face to face diagnostic evaluation.   I have reviewed the contents of history and physical exam as documented by PA/ARNP/Resident and agree with above documentation.  I have discussed and formulated the above plan as documented. Edits to the note have been made as needed.  Rylann Munford, MD [facility] [phone]   If 7pm to 7am, please call on call as listed on AMION.

## 2023-07-09 NOTE — TOC Initial Note (Signed)
 Transition of Care (TOC) - Initial/Assessment Note   Spoke to patient at bedside. From home alone. Independent prior to admission.    PT recommendations OP PT and tub bench.   Discussed OP PT and offered choice. Patient prefers OP PT on [address] Referral entered and asked MD to sign. Information on AVS.   Discussed tub / shower seat. Patient declined   Patient Details  Name: Erika Baker MRN: [id number] Date of Birth: Oct 06, 1950  Transition of Care [facility]) CM/SW Contact:    Terre Ferri, RN Phone Number: 07/09/2023, 9:13 AM  Clinical Narrative:                   Expected Discharge Plan: Home/Self Care Barriers to Discharge: Continued Medical Work up   Patient Goals and CMS Choice Patient states their goals for this hospitalization and ongoing recovery are:: to return to home   Choice offered to / list presented to : NA      Expected Discharge Plan and Services   Discharge Planning Services: CM Consult Post Acute Care Choice: NA Living arrangements for the past 2 months: Single Family Home                 DME Arranged: N/A       Representative spoke with at DME Agency: patient declined tub bench   HH Agency: NA        Prior Living Arrangements/Services Living arrangements for the past 2 months: Single Family Home Lives with:: Self Patient language and need for interpreter reviewed:: Yes Do you feel safe going back to the place where you live?: Yes      Need for Family Participation in Patient Care: Yes (Comment) Care giver support system in place?: Yes (comment)   Criminal Activity/Legal Involvement Pertinent to Current Situation/Hospitalization: No - Comment as needed  Activities of Daily Living   ADL Screening (condition at time of admission) Independently performs ADLs?: Yes (appropriate for developmental age) Is the patient deaf or have difficulty hearing?: Yes Does the patient have difficulty seeing, even when wearing  glasses/contacts?: Yes Does the patient have difficulty concentrating, remembering, or making decisions?: No  Permission Sought/Granted   Permission granted to share information with : Yes, Verbal Permission Granted     Permission granted to share info w AGENCY: OP PT on [address]        Emotional Assessment Appearance:: Appears stated age Attitude/Demeanor/Rapport: Engaged Affect (typically observed): Appropriate Orientation: : Oriented to Self, Oriented to  Time, Oriented to Situation Alcohol / Substance Use: Not Applicable Psych Involvement: No (comment)  Admission diagnosis:  Diplopia [H53.2] Intracranial mass [R90.0] Cranial nerve palsy [G52.9] Patient Active Problem List   Diagnosis Date Noted   Intracranial mass 07/05/2023   Diplopia 07/05/2023   Asthma, chronic 07/05/2023   Essential hypertension 07/05/2023   Hyperlipidemia 07/05/2023   Hypokalemia 07/05/2023   PCP:  Omie Bickers, MD Pharmacy:   [facility] [id number] - [address], [place] - [address] [place] [address] Phone: [phone] Fax: [phone]     Social Drivers of Health (SDOH) Social History: SDOH Screenings   Food Insecurity: No Food Insecurity (07/05/2023)  Housing: Low Risk  (07/05/2023)  Transportation Needs: No Transportation Needs (07/05/2023)  Utilities: Not At Risk (07/05/2023)  Social Connections: Socially Isolated (07/05/2023)  Tobacco Use: Low Risk  (07/05/2023)   SDOH Interventions:     Readmission Risk Interventions    07/06/2023   12:49 PM  Readmission Risk Prevention Plan  Post Dischage Appt Complete  Medication Screening Complete  Transportation Screening Complete

## 2023-07-09 NOTE — Progress Notes (Signed)
 PROGRESS NOTE  Erika Baker AVW:[id number] DOB: 12-13-1950   PCP: Omie Bickers, MD  Patient is from: Home. Lives alone. Independently ambulates at baseline   DOA: 07/05/2023 LOS: 4  Chief complaints Chief Complaint  Patient presents with   Diplopia     Brief Narrative / Interim history: 73 year old F with PMH of left hip tumor, breast mass s/p lumpectomy, asthma, HTN and HLD presenting with diplopia while driving the day prior to presentation and found to have intracranial mass on MRI brain.  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  MRI brain with contrast showed indistinct and enhancing infiltrative process throughout the central previous involving the sella/pituitary, smoothly involving the right throat clear well dural and questionably infiltrating the cavernous sinus.  Per radiology, differentials include lymphoma (likely favored), skull base invasive pituitary adenoma and invasive/atypical meningioma.  Neurology following.  Oncology consulted as well. Of note, patient was seen Ortho oncology at [facility] on 6/10.  She underwent biopsy.  Pathology is pending.  Subjective: Seen and examined earlier this morning.  No major events overnight or this morning.  Reports right-sided headache behind her right eye.  Rates her pain 8/10.  Patient's sister at bedside.  Objective: Vitals:   07/08/23 2108 07/08/23 2144 07/09/23 0428 07/09/23 0742  BP: (!) 139/90 (!) 139/90 (!) 136/59 133/66  Pulse: (!) 55   (!) 50  Resp:    16  Temp: 98.3 F (36.8 C)  98.1 F (36.7 C) 98.4 F (36.9 C)  TempSrc: Oral  Oral Oral  SpO2: 99%  99% 100%  Weight:      Height:        Examination:  GENERAL: No apparent distress.  Nontoxic. HEENT: MMM.  Vision and hearing grossly intact.  NECK: Supple.  No apparent JVD.  RESP:  No IWOB.  Fair  aeration bilaterally. CVS:  RRR. Heart sounds normal.  ABD/GI/GU: BS+. Abd soft, NTND.  MSK/EXT:  Moves extremities. No apparent deformity. No edema.  SKIN: no apparent skin lesion or wound NEURO: AA.  Oriented appropriately.  No apparent focal neuro deficit. PSYCH: Calm. Normal affect.   Consultants:  Neurology Oncology  Procedures: None  Microbiology summarized: None  Assessment and plan: Binocular diplopia/right-sided headache: In the setting of abnormal MRI brain findings raising concern for malignancy. Most recently seen in the oncology clinic for a mass that would identified in the left lower extremity.  Patient was referred to [facility] by cancer center.  She underwent biopsy.  Pathology is pending Medical oncology consulted and following on pathology results from biopsy.  Neurology on board and planning possible lumbar puncture.  Tylenol  as needed for pain   Right thigh tumor: Reports this for 1 to 2 years.  Did not seek care as she had to care for her husband who had a stroke.   Recently seen at [facility] by orthopedic oncology.   She underwent biopsy.  Pathology report is pending.  Oncology following on pathology results. -PT and OT eval.   History of asthma: Stable Bronchodilators as needed   Essential hypertension: Blood pressure is reasonably well-controlled. Continue home amlodipine, hydralazine and losartan.  Hypokalemia -Monitor replenish K and Mg as appropriate   Hyperlipidemia -Continue statin and Zetia  Body mass index is 28.35 kg/m.           DVT prophylaxis:  enoxaparin (LOVENOX) injection 40 mg Start: 07/05/23  2200  Code Status: Full code Family Communication: Updated patient's sister at bedside Level of care: Med-Surg Status is: Inpatient Remains inpatient appropriate because: Intracranial mass/diplopia   Final disposition: To be determined   35 minutes with more than 50% spent in reviewing records, counseling patient/family and  coordinating care.   Sch Meds:  Scheduled Meds:  amLODipine  10 mg Oral Daily   atorvastatin  40 mg Oral Daily   enoxaparin (LOVENOX) injection  40 mg Subcutaneous Q24H   ezetimibe  10 mg Oral Daily   hydrALAZINE  25 mg Oral BID   losartan  100 mg Oral Daily   polyethylene glycol  17 g Oral Daily   senna-docusate  2 tablet Oral BID   Continuous Infusions: PRN Meds:.acetaminophen  **OR** acetaminophen , ondansetron  **OR** ondansetron  (ZOFRAN ) IV  Antimicrobials: Anti-infectives (From admission, onward)    None        I have personally reviewed the following labs and images: CBC: Recent Labs  Lab 07/05/23 1233 07/06/23 0622 07/08/23 0616  WBC 8.2 7.8 7.9  NEUTROABS 5.5  --   --   HGB 14.4 13.3 13.2  HCT 42.9 39.5 39.3  MCV 92.1 90.8 90.8  PLT 298 290 288   BMP &GFR Recent Labs  Lab 07/05/23 1233 07/06/23 0622 07/08/23 0616  NA 137 137 134*  K 3.0* 3.6 3.9  CL 103 105 104  CO2 23 21* 23  GLUCOSE 116* 92 95  BUN 15 12 13   CREATININE 1.00 0.91 0.96  CALCIUM 10.0 9.2 9.5  MG  --   --  2.1   Estimated Creatinine Clearance: 47.2 mL/min (by C-G formula based on SCr of 0.96 mg/dL). Liver & Pancreas: Recent Labs  Lab 07/06/23 0622  AST 18  ALT 11  ALKPHOS 55  BILITOT 0.9  PROT 6.3*  ALBUMIN 3.3*   No results for input(s): LIPASE, AMYLASE in the last 168 hours. No results for input(s): AMMONIA in the last 168 hours. Diabetic: No results for input(s): HGBA1C in the last 72 hours. Recent Labs  Lab 07/05/23 1214  GLUCAP 116*   Cardiac Enzymes: No results for input(s): CKTOTAL, CKMB, CKMBINDEX, TROPONINI in the last 168 hours. No results for input(s): PROBNP in the last 8760 hours. Coagulation Profile: No results for input(s): INR, PROTIME in the last 168 hours. Thyroid  Function Tests: No results for input(s): TSH, T4TOTAL, FREET4, T3FREE, THYROIDAB in the last 72 hours. Lipid Profile: No results for input(s): CHOL,  HDL, LDLCALC, TRIG, CHOLHDL, LDLDIRECT in the last 72 hours. Anemia Panel: No results for input(s): VITAMINB12, FOLATE, FERRITIN, TIBC, IRON, RETICCTPCT in the last 72 hours. Urine analysis:    Component Value Date/Time   LABSPEC 1.020 06/23/2017 1320   PHURINE 6.0 06/23/2017 1320   GLUCOSEU NEGATIVE 06/23/2017 1320   HGBUR SMALL (A) 06/23/2017 1320   BILIRUBINUR SMALL (A) 06/23/2017 1320   KETONESUR NEGATIVE 06/23/2017 1320   PROTEINUR 30 (A) 06/23/2017 1320   UROBILINOGEN 0.2 06/23/2017 1320   NITRITE NEGATIVE 06/23/2017 1320   LEUKOCYTESUR SMALL (A) 06/23/2017 1320   Sepsis Labs: Invalid input(s): PROCALCITONIN, LACTICIDVEN  Microbiology: No results found for this or any previous visit (from the past 240 hours).  Radiology Studies: No results found.    Elanor Cale T. Meilyn Heindl [facility]  If 7PM-7AM, please contact night-coverage [email or web address] 07/09/2023, 3:18 PM

## 2023-07-10 DIAGNOSIS — R900 Intracranial space-occupying lesion found on diagnostic imaging of central nervous system: Secondary | ICD-10-CM | POA: Diagnosis not present

## 2023-07-10 DIAGNOSIS — E876 Hypokalemia: Secondary | ICD-10-CM | POA: Diagnosis not present

## 2023-07-10 DIAGNOSIS — H532 Diplopia: Secondary | ICD-10-CM | POA: Diagnosis not present

## 2023-07-10 DIAGNOSIS — J4520 Mild intermittent asthma, uncomplicated: Secondary | ICD-10-CM | POA: Diagnosis not present

## 2023-07-10 NOTE — Progress Notes (Signed)
 PROGRESS NOTE  Erika Baker FMW:[id number] DOB: 27-Jan-1950   PCP: Shona Norleen PEDLAR, MD  Patient is from: Home. Lives alone. Independently ambulates at baseline   DOA: 07/05/2023 LOS: 5  Chief complaints Chief Complaint  Patient presents with   Diplopia     Brief Narrative / Interim history: 73 year old F with PMH of left hip tumor, breast mass s/p lumpectomy, asthma, HTN and HLD presenting with diplopia while driving the day prior to presentation and found to have intracranial mass on MRI brain.  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  MRI brain with contrast showed indistinct and enhancing infiltrative process throughout the central previous involving the sella/pituitary, smoothly involving the right throat clear well dural and questionably infiltrating the cavernous sinus.  Per radiology, differentials include lymphoma (likely favored), skull base invasive pituitary adenoma and invasive/atypical meningioma.  Neurology following.  Oncology consulted as well. Of note, patient was seen Ortho oncology at [facility] on 6/10.  She underwent biopsy.  Pathology pending.  Neurology and oncology following.  Subjective: Seen and examined earlier this morning.  No major events overnight or this morning.  Continues to endorse pain behind her right eye.  Objective: Vitals:   07/09/23 1541 07/09/23 2023 07/10/23 0545 07/10/23 0751  BP: 129/62 139/72 125/60 (!) 141/67  Pulse: (!) 53 (!) 50 (!) 51 (!) 49  Resp: 16 18 18 17   Temp: 98.2 F (36.8 C) 98.2 F (36.8 C) 97.9 F (36.6 C) 98.2 F (36.8 C)  TempSrc: Oral Oral Oral Oral  SpO2: 100% 100% 100% 99%  Weight:      Height:        Examination:  GENERAL: No apparent distress.  Nontoxic. HEENT: MMM.  Impaired EOM in right eye.  Right eye ptosis. NECK: Supple.  No apparent JVD.   RESP:  No IWOB.  Fair aeration bilaterally. CVS:  RRR. Heart sounds normal.  ABD/GI/GU: BS+. Abd soft, NTND.  MSK/EXT:  Moves extremities. No apparent deformity. No edema.  SKIN: no apparent skin lesion or wound NEURO: AA.  Oriented appropriately. Impaired EOM in right eye.  Right eye ptosis.  No facial asymmetry.  Motor and sensation intact. PSYCH: Calm. Normal affect.   Consultants:  Neurology Oncology  Procedures: None  Microbiology summarized: None  Assessment and plan: Binocular diplopia/right-sided headache: In the setting of abnormal MRI brain findings raising concern for malignancy. Most recently seen in the oncology clinic for a mass that would identified in the left lower extremity.  Patient was referred to [facility] by cancer center.  She underwent biopsy.  Pathology is pending Medical oncology consulted and following on pathology results from biopsy.  Neurology on board and yet to decide about lumbar puncture.  Tylenol  as needed for pain   Right thigh tumor: Reports this for 1 to 2 years.  Did not seek care as she had to care for her husband who had a stroke.   Recently seen at [facility] by orthopedic oncology.   She underwent biopsy.  Pathology report is pending.  Oncology following on pathology results. -PT and OT eval.   History of asthma: Stable Bronchodilators as needed   Essential hypertension: Blood pressure is reasonably well-controlled. Continue home amlodipine , hydralazine  and losartan .  Hypokalemia -Monitor replenish K and Mg as appropriate   Hyperlipidemia -Continue statin and Zetia   Body mass index is 28.35 kg/m.  DVT prophylaxis:  enoxaparin  (LOVENOX ) injection 40 mg Start: 07/05/23 2200  Code Status: Full code Family Communication: None at bedside today. Level of care: Med-Surg Status is: Inpatient Remains inpatient appropriate because: Intracranial mass/diplopia   Final disposition: To be determined   35 minutes with  more than 50% spent in reviewing records, counseling patient/family and coordinating care.   Sch Meds:  Scheduled Meds:  amLODipine   10 mg Oral Daily   atorvastatin   40 mg Oral Daily   enoxaparin  (LOVENOX ) injection  40 mg Subcutaneous Q24H   ezetimibe   10 mg Oral Daily   hydrALAZINE   25 mg Oral BID   losartan   100 mg Oral Daily   polyethylene glycol  17 g Oral Daily   senna-docusate  2 tablet Oral BID   Continuous Infusions: PRN Meds:.acetaminophen  **OR** acetaminophen , ondansetron  **OR** ondansetron  (ZOFRAN ) IV  Antimicrobials: Anti-infectives (From admission, onward)    None        I have personally reviewed the following labs and images: CBC: Recent Labs  Lab 07/05/23 1233 07/06/23 0622 07/08/23 0616  WBC 8.2 7.8 7.9  NEUTROABS 5.5  --   --   HGB 14.4 13.3 13.2  HCT 42.9 39.5 39.3  MCV 92.1 90.8 90.8  PLT 298 290 288   BMP &GFR Recent Labs  Lab 07/05/23 1233 07/06/23 0622 07/08/23 0616  NA 137 137 134*  K 3.0* 3.6 3.9  CL 103 105 104  CO2 23 21* 23  GLUCOSE 116* 92 95  BUN 15 12 13   CREATININE 1.00 0.91 0.96  CALCIUM  10.0 9.2 9.5  MG  --   --  2.1   Estimated Creatinine Clearance: 47.2 mL/min (by C-G formula based on SCr of 0.96 mg/dL). Liver & Pancreas: Recent Labs  Lab 07/06/23 0622  AST 18  ALT 11  ALKPHOS 55  BILITOT 0.9  PROT 6.3*  ALBUMIN 3.3*   No results for input(s): LIPASE, AMYLASE in the last 168 hours. No results for input(s): AMMONIA in the last 168 hours. Diabetic: No results for input(s): HGBA1C in the last 72 hours. Recent Labs  Lab 07/05/23 1214  GLUCAP 116*   Cardiac Enzymes: No results for input(s): CKTOTAL, CKMB, CKMBINDEX, TROPONINI in the last 168 hours. No results for input(s): PROBNP in the last 8760 hours. Coagulation Profile: No results for input(s): INR, PROTIME in the last 168 hours. Thyroid  Function Tests: No results for input(s): TSH, T4TOTAL, FREET4, T3FREE, THYROIDAB  in the last 72 hours. Lipid Profile: No results for input(s): CHOL, HDL, LDLCALC, TRIG, CHOLHDL, LDLDIRECT in the last 72 hours. Anemia Panel: No results for input(s): VITAMINB12, FOLATE, FERRITIN, TIBC, IRON, RETICCTPCT in the last 72 hours. Urine analysis:    Component Value Date/Time   LABSPEC 1.020 06/23/2017 1320   PHURINE 6.0 06/23/2017 1320   GLUCOSEU NEGATIVE 06/23/2017 1320   HGBUR SMALL (A) 06/23/2017 1320   BILIRUBINUR SMALL (A) 06/23/2017 1320   KETONESUR NEGATIVE 06/23/2017 1320   PROTEINUR 30 (A) 06/23/2017 1320   UROBILINOGEN 0.2 06/23/2017 1320   NITRITE NEGATIVE 06/23/2017 1320   LEUKOCYTESUR SMALL (A) 06/23/2017 1320   Sepsis Labs: Invalid input(s): PROCALCITONIN, LACTICIDVEN  Microbiology: No results found for this or any previous visit (from the past 240 hours).  Radiology Studies: No results found.    Elin Seats T. Ziya Coonrod [facility]  If 7PM-7AM, please contact night-coverage [email or web address] 07/10/2023, 2:10 PM

## 2023-07-11 DIAGNOSIS — H532 Diplopia: Secondary | ICD-10-CM | POA: Diagnosis not present

## 2023-07-11 DIAGNOSIS — R900 Intracranial space-occupying lesion found on diagnostic imaging of central nervous system: Secondary | ICD-10-CM | POA: Diagnosis not present

## 2023-07-11 DIAGNOSIS — J4520 Mild intermittent asthma, uncomplicated: Secondary | ICD-10-CM | POA: Diagnosis not present

## 2023-07-11 DIAGNOSIS — E876 Hypokalemia: Secondary | ICD-10-CM | POA: Diagnosis not present

## 2023-07-11 MED ORDER — KETOROLAC TROMETHAMINE 15 MG/ML IJ SOLN
15.0000 mg | Freq: Four times a day (QID) | INTRAMUSCULAR | Status: DC | PRN
  Administered 2023-07-11 (×2): 15 mg via INTRAVENOUS
  Filled 2023-07-11 (×2): qty 1

## 2023-07-11 MED ORDER — ACETAMINOPHEN 500 MG PO TABS
1000.0000 mg | ORAL_TABLET | Freq: Four times a day (QID) | ORAL | Status: DC
  Administered 2023-07-11 – 2023-07-12 (×4): 1000 mg via ORAL
  Filled 2023-07-11 (×4): qty 2

## 2023-07-11 MED ORDER — KETOROLAC TROMETHAMINE 15 MG/ML IJ SOLN
15.0000 mg | Freq: Once | INTRAMUSCULAR | Status: AC
  Administered 2023-07-11: 15 mg via INTRAVENOUS
  Filled 2023-07-11: qty 1

## 2023-07-11 NOTE — Progress Notes (Signed)
 PROGRESS NOTE  Erika Baker FMW:[id number] DOB: [birth year]   PCP: Shona Norleen PEDLAR, MD  Patient is from: Home. Lives alone. Independently ambulates at baseline   DOA: 07/05/2023 LOS: 6  Chief complaints Chief Complaint  Patient presents with   Diplopia     Brief Narrative / Interim history: 73 year old F with PMH of left hip tumor, breast mass s/p lumpectomy, asthma, HTN and HLD presenting with diplopia while driving the day prior to presentation and found to have intracranial mass on MRI brain.  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  MRI brain with contrast showed indistinct and enhancing infiltrative process throughout the central previous involving the sella/pituitary, smoothly involving the right throat clear well dural and questionably infiltrating the cavernous sinus.  Per radiology, differentials include lymphoma (likely favored), skull base invasive pituitary adenoma and invasive/atypical meningioma.  Neurology following.  Oncology consulted as well. Of note, patient was seen Ortho oncology at [facility] on 6/10.  She underwent biopsy.  Pathology pending.  Neurology and oncology following.  Subjective: Seen and examined earlier this morning.  No major events overnight or this morning.  Complaining of proximal left thigh pain where she had a tumor.  She received Toradol last night.  Pain behind her right eye seems to have resolved.  Still with diplopia.  Objective: Vitals:   07/10/23 2018 07/10/23 2202 07/11/23 0410 07/11/23 0940  BP: (!) 146/73 (!) 148/84 126/63 (!) 143/63  Pulse: (!) 49  (!) 50 (!) 43  Resp: 17  17 16   Temp: 98.2 F (36.8 C)  98.4 F (36.9 C)   TempSrc: Oral  Oral   SpO2: 99%  99% 98%  Weight:      Height:        Examination:  GENERAL: No apparent distress.  Nontoxic. HEENT: MMM.  Impaired  EOM in right eye.  Right eye ptosis. NECK: Supple.  No apparent JVD.  RESP:  No IWOB.  Fair aeration bilaterally. CVS:  RRR. Heart sounds normal.  ABD/GI/GU: BS+. Abd soft, NTND.  MSK/EXT:  Moves extremities.  Left lateral thigh tumor.  Some tenderness proximally. SKIN: no apparent skin lesion or wound NEURO: AA.  Oriented appropriately. Impaired EOM in right eye.  Right eye ptosis.  No facial asymmetry.  Motor and sensation intact. PSYCH: Calm. Normal affect.   Consultants:  Neurology Oncology  Procedures: None  Microbiology summarized: None  Assessment and plan: Binocular diplopia/right-sided headache: In the setting of abnormal MRI brain findings raising concern for malignancy. Most recently seen in the oncology clinic for a mass that would identified in the left lower extremity.  Patient was referred to [facility] by cancer center.  She underwent biopsy.  Pathology is pending Medical oncology consulted and following on pathology results from biopsy.  Neurology on board and yet to decide about lumbar puncture.  Headache seems to have resolved after she received Toradol last night. - Schedule Tylenol  and as needed Toradol for pain   Right thigh tumor: Reports this for 1 to 2 years.  Did not seek care as she had to care for her husband who had a stroke.   Recently seen at [facility] by orthopedic oncology.   She underwent biopsy.  Pathology report is pending.  Oncology following on pathology results. -Schedule Tylenol  with as needed Toradol for pain -PT and OT eval.   History of asthma: Stable -Bronchodilators as  needed   Essential hypertension: Blood pressure is reasonably well-controlled. -Continue home amlodipine , hydralazine  and losartan .  Hypokalemia -Monitor replenish K and Mg as appropriate   Hyperlipidemia -Continue statin and Zetia   Hyponatremia: Mild - Continue monitor  Body mass index is 28.35 kg/m.           DVT prophylaxis:  enoxaparin  (LOVENOX )  injection 40 mg Start: 07/05/23 2200  Code Status: Full code Family Communication: Updated patient's cousin at bedside Level of care: Med-Surg Status is: Inpatient Remains inpatient appropriate because: Intracranial mass/diplopia   Final disposition: To be determined   35 minutes with more than 50% spent in reviewing records, counseling patient/family and coordinating care.   Sch Meds:  Scheduled Meds:  acetaminophen   1,000 mg Oral Q6H WA   amLODipine   10 mg Oral Daily   atorvastatin   40 mg Oral Daily   enoxaparin  (LOVENOX ) injection  40 mg Subcutaneous Q24H   ezetimibe   10 mg Oral Daily   hydrALAZINE   25 mg Oral BID   losartan   100 mg Oral Daily   polyethylene glycol  17 g Oral Daily   senna-docusate  2 tablet Oral BID   Continuous Infusions: PRN Meds:.ketorolac, ondansetron  **OR** ondansetron  (ZOFRAN ) IV  Antimicrobials: Anti-infectives (From admission, onward)    None        I have personally reviewed the following labs and images: CBC: Recent Labs  Lab 07/05/23 1233 07/06/23 0622 07/08/23 0616  WBC 8.2 7.8 7.9  NEUTROABS 5.5  --   --   HGB 14.4 13.3 13.2  HCT 42.9 39.5 39.3  MCV 92.1 90.8 90.8  PLT 298 290 288   BMP &GFR Recent Labs  Lab 07/05/23 1233 07/06/23 0622 07/08/23 0616  NA 137 137 134*  K 3.0* 3.6 3.9  CL 103 105 104  CO2 23 21* 23  GLUCOSE 116* 92 95  BUN 15 12 13   CREATININE 1.00 0.91 0.96  CALCIUM  10.0 9.2 9.5  MG  --   --  2.1   Estimated Creatinine Clearance: 47.2 mL/min (by C-G formula based on SCr of 0.96 mg/dL). Liver & Pancreas: Recent Labs  Lab 07/06/23 0622  AST 18  ALT 11  ALKPHOS 55  BILITOT 0.9  PROT 6.3*  ALBUMIN 3.3*   No results for input(s): LIPASE, AMYLASE in the last 168 hours. No results for input(s): AMMONIA in the last 168 hours. Diabetic: No results for input(s): HGBA1C in the last 72 hours. Recent Labs  Lab 07/05/23 1214  GLUCAP 116*   Cardiac Enzymes: No results for input(s):  CKTOTAL, CKMB, CKMBINDEX, TROPONINI in the last 168 hours. No results for input(s): PROBNP in the last 8760 hours. Coagulation Profile: No results for input(s): INR, PROTIME in the last 168 hours. Thyroid  Function Tests: No results for input(s): TSH, T4TOTAL, FREET4, T3FREE, THYROIDAB in the last 72 hours. Lipid Profile: No results for input(s): CHOL, HDL, LDLCALC, TRIG, CHOLHDL, LDLDIRECT in the last 72 hours. Anemia Panel: No results for input(s): VITAMINB12, FOLATE, FERRITIN, TIBC, IRON, RETICCTPCT in the last 72 hours. Urine analysis:    Component Value Date/Time   LABSPEC 1.020 06/23/2017 1320   PHURINE 6.0 06/23/2017 1320   GLUCOSEU NEGATIVE 06/23/2017 1320   HGBUR SMALL (A) 06/23/2017 1320   BILIRUBINUR SMALL (A) 06/23/2017 1320   KETONESUR NEGATIVE 06/23/2017 1320   PROTEINUR 30 (A) 06/23/2017 1320   UROBILINOGEN 0.2 06/23/2017 1320   NITRITE NEGATIVE 06/23/2017 1320   LEUKOCYTESUR SMALL (A) 06/23/2017 1320   Sepsis Labs: Invalid input(s): PROCALCITONIN,  LACTICIDVEN  Microbiology: No results found for this or any previous visit (from the past 240 hours).  Radiology Studies: No results found.    Mercades Bajaj T. Vikrant Pryce [facility]  If 7PM-7AM, please contact night-coverage [email or web address] 07/11/2023, 12:29 PM

## 2023-07-11 NOTE — Progress Notes (Signed)
 Pt complaining of left hip pain 10/10. She states that this is the first time that bothers her and started to cry. On call provider notified and ordered ketorolac IV. Re-assessed pt after an hour pt asleep. Will continue to monitor.

## 2023-07-12 DIAGNOSIS — J4520 Mild intermittent asthma, uncomplicated: Secondary | ICD-10-CM | POA: Diagnosis not present

## 2023-07-12 DIAGNOSIS — R900 Intracranial space-occupying lesion found on diagnostic imaging of central nervous system: Secondary | ICD-10-CM | POA: Diagnosis not present

## 2023-07-12 DIAGNOSIS — H532 Diplopia: Secondary | ICD-10-CM | POA: Diagnosis not present

## 2023-07-12 DIAGNOSIS — E876 Hypokalemia: Secondary | ICD-10-CM | POA: Diagnosis not present

## 2023-07-12 LAB — CBC
HCT: 40.5 % (ref 36.0–46.0)
Hemoglobin: 13.8 g/dL (ref 12.0–15.0)
MCH: 30.8 pg (ref 26.0–34.0)
MCHC: 34.1 g/dL (ref 30.0–36.0)
MCV: 90.4 fL (ref 80.0–100.0)
Platelets: 291 10*3/uL (ref 150–400)
RBC: 4.48 MIL/uL (ref 3.87–5.11)
RDW: 14 % (ref 11.5–15.5)
WBC: 8.3 10*3/uL (ref 4.0–10.5)
nRBC: 0 % (ref 0.0–0.2)

## 2023-07-12 LAB — RENAL FUNCTION PANEL
Albumin: 3.3 g/dL — ABNORMAL LOW (ref 3.5–5.0)
Anion gap: 14 (ref 5–15)
BUN: 13 mg/dL (ref 8–23)
CO2: 18 mmol/L — ABNORMAL LOW (ref 22–32)
Calcium: 9.5 mg/dL (ref 8.9–10.3)
Chloride: 103 mmol/L (ref 98–111)
Creatinine, Ser: 0.93 mg/dL (ref 0.44–1.00)
GFR, Estimated: >60 mL/min (ref >60)
Glucose, Bld: 95 mg/dL (ref 70–99)
Phosphorus: 3.3 mg/dL (ref 2.5–4.6)
Potassium: 3.6 mmol/L (ref 3.5–5.1)
Sodium: 135 mmol/L (ref 135–145)

## 2023-07-12 MED ORDER — ACETAMINOPHEN 500 MG PO TABS: 1000.0000 mg | ORAL_TABLET | Freq: Three times a day (TID) | ORAL | Status: DC | PRN

## 2023-07-12 MED ORDER — SENNOSIDES-DOCUSATE SODIUM 8.6-50 MG PO TABS: 1.0000 | ORAL_TABLET | Freq: Two times a day (BID) | ORAL | Status: DC | PRN

## 2023-07-12 NOTE — Progress Notes (Signed)
 Occupational Therapy Treatment Patient Details Name: Erika Baker MRN: [id number] DOB: 12/30/1950 Today's Date: 07/12/2023   History of present illness Erika Baker is a 73 y.o. female with medical history significant of asthma, essential hypertension, hyperlipidemia, history of breast mass status postlumpectomy, history of left hip tumor presenting to the ER for evaluation of diplopia.  Symptoms started yesterday while she was driving.  Has been constant out of the right eye.  Some discomfort behind her eye.  No headaches.  Denied any weakness.  Denied any other complaint.  Patient was seen and evaluated in the ER.  Initial workup showed the diplopia.  Patient also has intracranial mass on MRI.  Patient appears to have findings consistent with lymphoma.  Patient being admitted for further evaluation and treatment.  Neurology consulted.   OT comments  Pt in bed ready to d/c home today. Pt continues to report blurred and double vision in R eye not present PTA. At baseline pt wears glasses. Pt's vision assessed by occlusion of B eyes as well as with partial occlusion of R eye with taping of glasses vertically at nasal portion. Pt demos poor acuity of R eye, impaired oculomotor movement in ABD, superiorly and inferiorly, disconjugate gaze, impaired depth perception and partial ptosis with vertical and horizontal diplopia. R side of eyeglasses taped vertically improved double vision in R eye. Pt educated on home safety with ADLs and mobility in regards to her visual impairments. Pt verbalized understanding. Recommend pt follow up with eye Dr as well as referral for neuro OP OT for vision      If plan is discharge home, recommend the following:  Assistance with cooking/housework;Assist for transportation;Direct supervision/assist for medications management   Equipment Recommendations  Tub/shower seat    Recommendations for Other Services      Precautions / Restrictions  Precautions Precautions: Fall Restrictions Weight Bearing Restrictions Per Provider Order: No       Mobility Bed Mobility Overal bed mobility: Modified Independent                  Transfers                         Balance Overall balance assessment: Needs assistance Sitting-balance support: No upper extremity supported Sitting balance-Leahy Scale: Good                                     ADL either performed or assessed with clinical judgement   ADL   Eating/Feeding: Independent                                          Extremity/Trunk Assessment Upper Extremity Assessment Upper Extremity Assessment: Overall WFL for tasks assessed   Lower Extremity Assessment Lower Extremity Assessment: Defer to PT evaluation   Cervical / Trunk Assessment Cervical / Trunk Assessment: Normal    Vision Baseline Vision/History: 1 Wears glasses Ability to See in Adequate Light: 0 Adequate Patient Visual Report: Blurring of vision;Diplopia Additional Comments: pt reports double and blurry vision of R eye. See OT comments in completed progress note for details of visual assessment and recommendations   Perception Perception Perception: Impaired Perception-Other Comments: depth perception impaired   Praxis     Communication Communication Communication: No apparent difficulties  Cognition Arousal: Alert Behavior During Therapy: WFL for tasks assessed/performed Cognition: No apparent impairments                               Following commands: Intact        Cueing   Cueing Techniques: Verbal cues  Exercises      Shoulder Instructions       General Comments      Pertinent Vitals/ Pain       Pain Assessment Pain Assessment: Faces Faces Pain Scale: Hurts a little bit Pain Location: L hip/thigh Pain Descriptors / Indicators: Aching, Dull Pain Intervention(s): Monitored during session, Repositioned  Home  Living                                          Prior Functioning/Environment              Frequency  Min 2X/week        Progress Toward Goals  OT Goals(current goals can now be found in the care plan section)  Progress towards OT goals: Progressing toward goals     Plan      Co-evaluation                 AM-PAC OT 6 Clicks Daily Activity     Outcome Measure   Help from another person eating meals?: None Help from another person taking care of personal grooming?: None Help from another person toileting, which includes using toliet, bedpan, or urinal?: None Help from another person bathing (including washing, rinsing, drying)?: A Little Help from another person to put on and taking off regular upper body clothing?: None Help from another person to put on and taking off regular lower body clothing?: A Little 6 Click Score: 22    End of Session    OT Visit Diagnosis: Other abnormalities of gait and mobility (R26.89);Other (comment) (R eye vision impaired)   Activity Tolerance Patient tolerated treatment well   Patient Left with call bell/phone within reach;in bed;with family/visitor present;Other (comment) (sitting EOB)   Nurse Communication          Time: 8997-8962 OT Time Calculation (min): 35 min  Charges: OT General Charges $OT Visit: 1 Visit OT Treatments $Therapeutic Activity: 8-22 mins $Neuromuscular Re-education: 8-22 mins    Erika Baker Loose 07/12/2023, 10:51 AM
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
## 2023-07-12 NOTE — TOC Progression Note (Addendum)
 Transition of Care (TOC) - Progression Note   OP PT arranged.   Order for shower chair. Spoke with patient again at bedside and confirmed she does not want shower chair   OT requesting OPOT at neuro rehab, order for OP PT and OT changed. Asked MD to sign order. Updated AVS and asked RN to reprint AVS . OT discussed with patient  Patient Details  Name: Erika Baker MRN: [id number] Date of Birth: 01/31/1950  Transition of [facility]) CM/SW Contact  Kiyomi Pallo, Powell Jansky, RN Phone Number: 07/12/2023, 9:19 AM  Clinical Narrative:       Expected Discharge Plan: Home/Self Care Barriers to Discharge: Continued Medical Work up  Expected Discharge Plan and Services   Discharge Planning Services: CM Consult Post Acute Care Choice: NA Living arrangements for the past 2 months: Single Family Home Expected Discharge Date: 07/12/23               DME Arranged: N/A       Representative spoke with at DME Agency: patient declined tub bench   HH Agency: NA         Social Determinants of Health (SDOH) Interventions SDOH Screenings   Food Insecurity: No Food Insecurity (07/05/2023)  Housing: Low Risk  (07/05/2023)  Transportation Needs: No Transportation Needs (07/05/2023)  Utilities: Not At Risk (07/05/2023)  Social Connections: Socially Isolated (07/05/2023)  Tobacco Use: Low Risk  (07/05/2023)    Readmission Risk Interventions    07/06/2023   12:49 PM  Readmission Risk Prevention Plan  Post Dischage Appt Complete  Medication Screening Complete  Transportation Screening Complete

## 2023-07-12 NOTE — Progress Notes (Signed)
 Oncology Navigation Follow Up Encounter  Call to patient due to missed appointment, left VM with return call contact       The plan for the patient is : diagnostics   All questions answered to patient satisfaction and patient encouraged to communicate back if further needs or concerns arise.    Nichole Vivian Lunger, RN

## 2023-07-12 NOTE — Progress Notes (Signed)
 Physical Therapy Treatment Patient Details Name: JOHNA KEARL MRN: [id number] DOB: 20-Jul-1950 Today's Date: 07/12/2023   History of Present Illness Erika Baker is a 73 y.o. female with medical history significant of asthma, essential hypertension, hyperlipidemia, history of breast mass status postlumpectomy, history of left hip tumor presenting to the ER for evaluation of diplopia.  Symptoms started yesterday while she was driving.  Has been constant out of the right eye.  Some discomfort behind her eye.  No headaches.  Denied any weakness.  Denied any other complaint.  Patient was seen and evaluated in the ER.  Initial workup showed the diplopia.  Patient also has intracranial mass on MRI.  Patient appears to have findings consistent with lymphoma.  Patient being admitted for further evaluation and treatment.  Neurology consulted.    PT Comments  Pt resting in bed on arrival and agreeable to session with good progress towards acute goals. Pt continues to demonstrate gait without AD support, increasing distance >300' this session, at grossly CGA- supervision level with no overt LOB noted Pt with continued mild instability noted with all standing activity with antalgic gait pattern due to L hip/thigh pain. Pt declining RW use to offload Les to reduce discomfort despite education. Pt continues to benefit from skilled PT services to progress toward functional mobility goals.     If plan is discharge home, recommend the following:     Can travel by private vehicle        Equipment Recommendations  None recommended by PT    Recommendations for Other Services       Precautions / Restrictions Precautions Precautions: Fall Restrictions Weight Bearing Restrictions Per Provider Order: No     Mobility  Bed Mobility Overal bed mobility: Modified Independent             General bed mobility comments: from flat bed without rail use    Transfers Overall transfer level: Needs  assistance Equipment used: None Transfers: Sit to/from Stand Sit to Stand: Supervision           General transfer comment: sup for safety with power up    Ambulation/Gait Ambulation/Gait assistance: Contact guard assist Gait Distance (Feet): 350 Feet Assistive device: None Gait Pattern/deviations: Step-through pattern, Decreased stride length, Shuffle Gait velocity: fair/decr     General Gait Details: antalgic gait with decreased stance time and weight shift to Lt due to hip pain, no overt LOB and steady pace throughout.   Stairs             Wheelchair Mobility     Tilt Bed    Modified Rankin (Stroke Patients Only)       Balance Overall balance assessment: Needs assistance Sitting-balance support: No upper extremity supported Sitting balance-Leahy Scale: Good     Standing balance support: No upper extremity supported, During functional activity Standing balance-Leahy Scale: Fair                              Hotel manager: No apparent difficulties  Cognition Arousal: Alert Behavior During Therapy: WFL for tasks assessed/performed   PT - Cognitive impairments: No family/caregiver present to determine baseline, No apparent impairments                       PT - Cognition Comments: pleasnat and cooperative Following commands: Intact      Cueing Cueing Techniques: Verbal cues  Exercises  General Comments        Pertinent Vitals/Pain Pain Assessment Pain Assessment: Faces Pain Location: L hip/thigh Pain Descriptors / Indicators: Aching, Dull Pain Intervention(s): Monitored during session, Limited activity within patient's tolerance    Home Living                          Prior Function            PT Goals (current goals can now be found in the care plan section) Acute Rehab PT Goals PT Goal Formulation: With patient Time For Goal Achievement: 07/22/23 Progress towards PT  goals: Progressing toward goals    Frequency    Min 2X/week      PT Plan      Co-evaluation              AM-PAC PT 6 Clicks Mobility   Outcome Measure  Help needed turning from your back to your side while in a flat bed without using bedrails?: None Help needed moving from lying on your back to sitting on the side of a flat bed without using bedrails?: None Help needed moving to and from a bed to a chair (including a wheelchair)?: A Little Help needed standing up from a chair using your arms (e.g., wheelchair or bedside chair)?: A Little Help needed to walk in hospital room?: A Little Help needed climbing 3-5 steps with a railing? : A Little 6 Click Score: 20    End of Session Equipment Utilized During Treatment: Gait belt Activity Tolerance: Patient tolerated treatment well Patient left: Other (comment) (up in bathrrom for wash up) Nurse Communication: Mobility status PT Visit Diagnosis: Unsteadiness on feet (R26.81);Other abnormalities of gait and mobility (R26.89);Muscle weakness (generalized) (M62.81);Difficulty in walking, not elsewhere classified (R26.2);Pain Pain - Right/Left: Left Pain - part of body: Hip     Time: 0910-0920 PT Time Calculation (min) (ACUTE ONLY): 10 min  Charges:    $Gait Training: 8-22 mins PT General Charges $$ ACUTE PT VISIT: 1 Visit                     Samuella Rasool R. PTA Acute Rehabilitation Services Office: [phone]   Therisa CHRISTELLA Boor 07/12/2023, 10:29 AM

## 2023-07-14 ENCOUNTER — Encounter: Payer: Self-pay | Admitting: Medical Oncology

## 2023-07-14 NOTE — Progress Notes (Signed)
 Outgoing call to patient to confirm that she had heard from [facility] for reschedule of missed appointment while she was hospitalized. LVM with patient for return call. Patient thanked and my direct call back number provided.   LVM with [facility] to confirm that patient has been rescheduled for missed appointment due to her hospitalization. Call back number provided.  Colene KYM Raider, RN, BSN, [facility] Oncology Nurse Navigator, [facility] 07/14/2023 10:20 AM

## 2023-07-16 ENCOUNTER — Encounter: Payer: Self-pay | Admitting: Medical Oncology

## 2023-07-16 NOTE — Progress Notes (Signed)
 Outgoing call to patient regarding a message she left with [facility]. I spoke with patient, she was requesting contact number for [facility] in which I provided the contact number to Dr. Henderson office. Patient was reminded to reschedule her missed appointment due to her hospitalization. Patient expressed thanks and confirmed that she will reschedule. Patient encouraged to call me with questions/concerns.   Outgoing call to [facility] to request patient biopsy results.  Office stated biopsy is still pending and there are no preliminary results at this time. Office stated that the biopsy sample was sent to [facility] for testing.    Colene KYM Raider, RN, BSN, [facility] Oncology Nurse Navigator, [facility] 07/16/2023 12:20 PM

## 2023-07-19 NOTE — Progress Notes (Signed)
 Oncology Navigation Follow Up Encounter  Call  to patient. She stated the reason she missed her appointment was because she was in the hospital at [facility]. Patient reported she does not have any transportation issues and she knows about her appointment on 7/22 with Dr. Tanda       The plan for the patient is : diagnostics   All questions answered to patient satisfaction and patient encouraged to communicate back if further needs or concerns arise.    Nichole Vivian Lunger, RN

## 2023-07-21 ENCOUNTER — Inpatient Hospital Stay

## 2023-07-21 ENCOUNTER — Encounter: Payer: Self-pay | Admitting: Medical Oncology

## 2023-07-21 ENCOUNTER — Inpatient Hospital Stay: Attending: Physician Assistant | Admitting: Physician Assistant

## 2023-07-21 ENCOUNTER — Other Ambulatory Visit: Payer: Self-pay

## 2023-07-21 VITALS — BP 124/83 | HR 71 | Temp 98.2°F | Resp 14 | Wt 145.5 lb

## 2023-07-21 VITALS — BP 137/69 | HR 64 | Resp 19

## 2023-07-21 DIAGNOSIS — R112 Nausea with vomiting, unspecified: Secondary | ICD-10-CM

## 2023-07-21 DIAGNOSIS — R2242 Localized swelling, mass and lump, left lower limb: Secondary | ICD-10-CM | POA: Insufficient documentation

## 2023-07-21 DIAGNOSIS — M7989 Other specified soft tissue disorders: Secondary | ICD-10-CM

## 2023-07-21 DIAGNOSIS — R918 Other nonspecific abnormal finding of lung field: Secondary | ICD-10-CM | POA: Diagnosis not present

## 2023-07-21 DIAGNOSIS — R319 Hematuria, unspecified: Secondary | ICD-10-CM | POA: Insufficient documentation

## 2023-07-21 DIAGNOSIS — D72829 Elevated white blood cell count, unspecified: Secondary | ICD-10-CM | POA: Insufficient documentation

## 2023-07-21 LAB — CBC WITH DIFFERENTIAL (CANCER CENTER ONLY)
Abs Immature Granulocytes: 0.1 10*3/uL — ABNORMAL HIGH (ref 0.00–0.07)
Basophils Absolute: 0.1 10*3/uL (ref 0.0–0.1)
Basophils Relative: 0 %
Eosinophils Absolute: 0.1 10*3/uL (ref 0.0–0.5)
Eosinophils Relative: 1 %
HCT: 43.3 % (ref 36.0–46.0)
Hemoglobin: 14.9 g/dL (ref 12.0–15.0)
Immature Granulocytes: 1 %
Lymphocytes Relative: 22 %
Lymphs Abs: 2.9 10*3/uL (ref 0.7–4.0)
MCH: 30.4 pg (ref 26.0–34.0)
MCHC: 34.4 g/dL (ref 30.0–36.0)
MCV: 88.4 fL (ref 80.0–100.0)
Monocytes Absolute: 1 10*3/uL (ref 0.1–1.0)
Monocytes Relative: 7 %
Neutro Abs: 9 10*3/uL — ABNORMAL HIGH (ref 1.7–7.7)
Neutrophils Relative %: 69 %
Platelet Count: 318 10*3/uL (ref 150–400)
RBC: 4.9 MIL/uL (ref 3.87–5.11)
RDW: 14.3 % (ref 11.5–15.5)
WBC Count: 13.1 10*3/uL — ABNORMAL HIGH (ref 4.0–10.5)
nRBC: 0.2 % (ref 0.0–0.2)

## 2023-07-21 LAB — URINALYSIS, COMPLETE (UACMP) WITH MICROSCOPIC
Bilirubin Urine: NEGATIVE
Glucose, UA: NEGATIVE mg/dL
Ketones, ur: 5 mg/dL — AB
Nitrite: NEGATIVE
Protein, ur: NEGATIVE mg/dL
Specific Gravity, Urine: 1.004 — ABNORMAL LOW (ref 1.005–1.030)
pH: 7 (ref 5.0–8.0)

## 2023-07-21 LAB — CMP (CANCER CENTER ONLY)
ALT: 23 U/L (ref 0–44)
AST: 29 U/L (ref 15–41)
Albumin: 3.8 g/dL (ref 3.5–5.0)
Alkaline Phosphatase: 131 U/L — ABNORMAL HIGH (ref 38–126)
Anion gap: 10 (ref 5–15)
BUN: 14 mg/dL (ref 8–23)
CO2: 27 mmol/L (ref 22–32)
Calcium: 10.4 mg/dL — ABNORMAL HIGH (ref 8.9–10.3)
Chloride: 96 mmol/L — ABNORMAL LOW (ref 98–111)
Creatinine: 1.1 mg/dL — ABNORMAL HIGH (ref 0.44–1.00)
GFR, Estimated: 53 mL/min — ABNORMAL LOW (ref >60)
Glucose, Bld: 77 mg/dL (ref 70–99)
Potassium: 3.6 mmol/L (ref 3.5–5.1)
Sodium: 133 mmol/L — ABNORMAL LOW (ref 135–145)
Total Bilirubin: 1.7 mg/dL — ABNORMAL HIGH (ref 0.0–1.2)
Total Protein: 7.2 g/dL (ref 6.5–8.1)

## 2023-07-21 LAB — MAGNESIUM: Magnesium: 1.9 mg/dL (ref 1.7–2.4)

## 2023-07-21 MED ORDER — SODIUM CHLORIDE 0.9 % IV SOLN: Freq: Once | INTRAVENOUS | Status: AC

## 2023-07-21 MED ORDER — ONDANSETRON HCL 4 MG/2ML IJ SOLN
8.0000 mg | Freq: Once | INTRAMUSCULAR | Status: AC
  Administered 2023-07-21: 8 mg via INTRAVENOUS
  Filled 2023-07-21: qty 4

## 2023-07-21 MED ORDER — ONDANSETRON HCL 8 MG PO TABS: 8.0000 mg | ORAL_TABLET | Freq: Three times a day (TID) | ORAL | 1 refills | Status: DC | PRN

## 2023-07-21 NOTE — Progress Notes (Signed)
 [facility]   Outgoing call to Dr. Henderson office at [facility] to request results to patient's biopsy again. I was informed by the nurse triage that they will send message to Dr. Henderson staff regarding my request.   Colene KYM Raider, RN, BSN, [facility] Oncology Nurse Navigator, [facility] 07/21/2023 11:59 AM

## 2023-07-21 NOTE — Progress Notes (Signed)
 [facility] Clinical Social Work  Initial Assessment   Erika Baker is a 73 y.o. year old female accompanied by patient and sister. Clinical Social Work was referred by nurse for assessment of psychosocial needs.   SDOH (Social Determinants of Health) assessments performed: No   SDOH Screenings   Food Insecurity: No Food Insecurity (07/05/2023)  Housing: Low Risk  (07/05/2023)  Transportation Needs: No Transportation Needs (07/05/2023)  Utilities: Not At Risk (07/05/2023)  Social Connections: Socially Isolated (07/05/2023)  Tobacco Use: Low Risk  (07/05/2023)     Distress Screen completed: No     No data to display            Family/Social Information:  Housing Arrangement: patient lives alone Family members/support persons in your life? Patient named sister, cousin, and nephews as support team. Transportation concerns: Patient is still awaiting biopsy results. IF treatment included regular visits to cancer center, patient's sister or cousin will transport.  Employment: Retired  Income source: Patent attorney concerns: No  Type of concern: None Food access concerns: No food access concerns mentioned at visit. Religious or spiritual practice: Not known Advanced directives: No Coping/ Adjustment to diagnosis: Patient understands treatment plan and what happens next? Patient understands diagnosis and still awaiting biopsy results. Patients treatment has not been established. Concerns about diagnosis and/or treatment: I'm not especially worried about anything Patient denied any challenges with mood related to diagnosis.  Patient reported stressors: No reported stressors Current coping skills/ strengths: Ability for insight , Active sense of humor , Average or above average intelligence , Capable of independent living , Communication skills , Financial means , General fund of knowledge , Motivation for treatment/growth , and Supportive family/friends      SUMMARY: Current SDOH Barriers:  Socially Isolated SDOH identified  Clinical Social Work Clinical Goal(s):  No clinical social work goals at this time  Interventions: CSW provided education of role on support team Provided CSW contact information and encouraged patient to call with any questions or concerns   Follow Up Plan: Patient will contact CSW with any support or resource needs Patient verbalizes understanding of plan: Yes    Lizbeth Sprague, LCSW Clinical Social Worker [facility]

## 2023-07-21 NOTE — Progress Notes (Signed)
 [facility]  Received message from Dr. Lafonda nurse, Landry Arabia, informing me that patient called and left message about not feeling well and crying.   I called patient to inquire of what symptoms she was having. Patient stated that she has been very nauseous for the last 2 weeks and since she was discharged from the the hospital. Patient reports that she isn't able to eat and has been just drinking water and ice cubes. Patient reports she vomited once after eating tomato soup and another time after drinking orange juice. Pain reviewed with patient and she reports a 5 on a scale of 0 to 10 to her biopsied hip. Patient denies other issues at this time.  Symptoms reviewed with [facility] team and patient to be seen in [facility] for nausea management and assessment. Patient confirms her sister will bring her in today by 1:30.   Patient knows to call in the meantime for further questions/concerns.   Colene KYM Raider, RN, BSN, [facility] Oncology Nurse Navigator, [facility] 07/21/2023 12:54 PM

## 2023-07-21 NOTE — Progress Notes (Signed)
 Symptom Management Consult Note [facility]    Patient Care Team: Shona Norleen PEDLAR, MD as PCP - General (Internal Medicine)    Name / MRN / DOB: Erika Baker  [id number]  [birth year]   Date of visit: 07/21/2023   Chief Complaint/Reason for visit: nausea and vomiting     ASSESSMENT AND PLAN Patient is a 73 y.o. female with currently undergoing work up for left thigh mass followed by Dr. Federico.  I have viewed most recent oncology note and lab work.  #Left thigh mass - Initially seen in [facility] for left thigh mass and referred to [facility] for possible sarcoma. - Patient had initial consult 6/10 and biopsy was performed. Results are still pending.   #Symptom management: Nausea and vomiting - Reviewed CT CAP from 07/06/23 and it showed no evidence of metastatic disease in abdomen/pelvis and no acute GI findings. - Patient is well appearing. Abdominal exam is benign. She has dry mucus membranes. -CMP without significant electrolyte derangement. Renal function slightly elevated suggestive of dehydration from poor PO intake. Patient agrees to push fluids at home.  - CBC with leukocytosis 13.1. Patent denies infectious symptoms and is afebrile. Will check UA to r/o UTI. - Administered 1L NS and IV zofran  for symptom management in clinic. -On reassessment patient is asymptomatic. She is tolerating PO intake with crackers and water.  #Lung nodules - Patient had recent hospital admission and was found to have intracranial mass on brain MRI and CT CAP showing innumerable bilateral pulmonary metastases, including a dominant 3.6 cm mass. - Discussed CT findings with patient and sister. She is agreeable to further work up with lung nodule biopsy. Will consult pulmonology for eval and possible EBUS. First available pulmonology appointment is 08/16/23. They will try to seen patient sooner if possible.    Strict ED precautions discussed should symptoms worsen.   HEME/ONC  HISTORY Oncology History   No history exists.      INTERVAL HISTORY  Discussed the use of AI scribe software for clinical note transcription with the patient, who gave verbal consent to proceed.    Erika Baker is a 73 y.o. female with pertinent history as above presenting to [facility] today with chief complaint of nausea and vomiting. She is accompanied by her sister who provides additional information.  She has been experiencing persistent nausea and vomiting since her recent hospital discharge. The nausea is constant throughout the day, and vomiting is triggered by consuming acidic foods like tomato soup and orange juice. Water and ice are tolerated without inducing vomiting. She drinks at least 20 ounces of water daily. No nausea was experienced during her hospital stay. She notes a change in urine color, initially 'real yellow' and turning more red today. She is unsure of her last bowel movement. No history of urinary tract infections, fever, or abdominal pain.   She experiences left leg pain described as an ache that worsens when lying down at night, rated as five out of ten in severity. She has been taking Tylenol  for eye pain, which may also alleviate her leg pain. Pain is coming from the mass in her leg she reports. No other pain medications are used at home.    ROS  All other systems are reviewed and are negative for acute change except as noted in the HPI.    No Known Allergies   Past Medical History:  Diagnosis Date   Asthma    no problems in 50yrs  Atypical ductal hyperplasia of left breast    Hyperlipidemia    Hypertension      Past Surgical History:  Procedure Laterality Date   ABDOMINAL HYSTERECTOMY     partial    BREAST LUMPECTOMY WITH RADIOACTIVE SEED LOCALIZATION Left 02/20/2020   Procedure: LEFT BREAST LUMPECTOMY WITH RADIOACTIVE SEED LOCALIZATION;  Surgeon: Belinda Cough, MD;  Location: [facility];  Service: General;  Laterality: Left;  LMA    COLONOSCOPY     CYSTECTOMY Right    shoulder    Social History   Socioeconomic History   Marital status: Widowed    Spouse name: Not on file   Number of children: Not on file   Years of education: Not on file   Highest education level: Not on file  Occupational History   Not on file  Tobacco Use   Smoking status: Never   Smokeless tobacco: Never  Substance and Sexual Activity   Alcohol use: Never   Drug use: Never   Sexual activity: Not on file  Other Topics Concern   Not on file  Social History Narrative   Not on file   Social Drivers of Health   Financial Resource Strain: Not on file  Food Insecurity: No Food Insecurity (07/05/2023)   Hunger Vital Sign    Worried About Running Out of Food in the Last Year: Never true    Ran Out of Food in the Last Year: Never true  Transportation Needs: No Transportation Needs (07/05/2023)   PRAPARE - Administrator, Civil Service (Medical): No    Lack of Transportation (Non-Medical): No  Physical Activity: Not on file  Stress: Not on file  Social Connections: Socially Isolated (07/05/2023)   Social Connection and Isolation Panel    Frequency of Communication with Friends and Family: More than three times a week    Frequency of Social Gatherings with Friends and Family: Twice a week    Attends Religious Services: Patient declined    Database administrator or Organizations: No    Attends Banker Meetings: Never    Marital Status: Widowed  Intimate Partner Violence: Not At Risk (07/05/2023)   Humiliation, Afraid, Rape, and Kick questionnaire    Fear of Current or Ex-Partner: No    Emotionally Abused: No    Physically Abused: No    Sexually Abused: No    No family history on file.   Current Outpatient Medications:    ondansetron  (ZOFRAN ) 8 MG tablet, Take 1 tablet (8 mg total) by mouth every 8 (eight) hours as needed for nausea or vomiting., Disp: 20 tablet, Rfl: 1   acetaminophen  (TYLENOL ) 500 MG  tablet, Take 2 tablets (1,000 mg total) by mouth every 8 (eight) hours as needed for mild pain (pain score 1-3) or moderate pain (pain score 4-6)., Disp: , Rfl:    amLODipine  (NORVASC ) 10 MG tablet, Take 10 mg by mouth daily., Disp: , Rfl:    atorvastatin  (LIPITOR) 10 MG tablet, Take 40 mg by mouth daily.  (Patient not taking: Reported on 07/05/2023), Disp: , Rfl:    ezetimibe  (ZETIA ) 10 MG tablet, , Disp: , Rfl:    hydrALAZINE  (APRESOLINE ) 25 MG tablet, Take 25 mg by mouth in the morning and at bedtime., Disp: , Rfl:    losartan  (COZAAR ) 100 MG tablet, Take 100 mg by mouth daily., Disp: , Rfl:    senna-docusate (SENOKOT-S) 8.6-50 MG tablet, Take 1-2 tablets by mouth 2 (two) times daily between meals  as needed for mild constipation or moderate constipation., Disp: , Rfl:   PHYSICAL EXAM ECOG FS:1 - Symptomatic but completely ambulatory    Vitals:   07/21/23 1338  BP: 124/83  Pulse: 71  Resp: 14  Temp: 98.2 F (36.8 C)  TempSrc: Temporal  SpO2: 100%  Weight: 145 lb 8 oz (66 kg)   Physical Exam Vitals and nursing note reviewed.  Constitutional:      Appearance: She is not ill-appearing or toxic-appearing.  HENT:     Head: Normocephalic.     Mouth/Throat:     Mouth: Mucous membranes are dry.  Eyes:     Conjunctiva/sclera: Conjunctivae normal.  Cardiovascular:     Rate and Rhythm: Normal rate and regular rhythm.     Pulses: Normal pulses.     Heart sounds: Normal heart sounds.  Pulmonary:     Effort: Pulmonary effort is normal.     Breath sounds: Normal breath sounds.  Abdominal:     General: There is no distension.  Musculoskeletal:     Cervical back: Normal range of motion.  Skin:    General: Skin is warm and dry.  Neurological:     Mental Status: She is alert.        LABORATORY DATA I have reviewed the data as listed    Latest Ref Rng & Units 07/21/2023    1:45 PM 07/12/2023    6:31 AM 07/08/2023    6:16 AM  CBC  WBC 4.0 - 10.5 K/uL 13.1  8.3  7.9   Hemoglobin  12.0 - 15.0 g/dL 85.0  86.1  86.7   Hematocrit 36.0 - 46.0 % 43.3  40.5  39.3   Platelets 150 - 400 K/uL 318  291  288         Latest Ref Rng & Units 07/21/2023    1:45 PM 07/12/2023    6:31 AM 07/08/2023    6:16 AM  CMP  Glucose 70 - 99 mg/dL 77  95  95   BUN 8 - 23 mg/dL 14  13  13    Creatinine 0.44 - 1.00 mg/dL 8.89  9.06  9.03   Sodium 135 - 145 mmol/L 133  135  134   Potassium 3.5 - 5.1 mmol/L 3.6  3.6  3.9   Chloride 98 - 111 mmol/L 96  103  104   CO2 22 - 32 mmol/L 27  18  23    Calcium  8.9 - 10.3 mg/dL 89.5  9.5  9.5   Total Protein 6.5 - 8.1 g/dL 7.2     Total Bilirubin 0.0 - 1.2 mg/dL 1.7     Alkaline Phos 38 - 126 U/L 131     AST 15 - 41 U/L 29     ALT 0 - 44 U/L 23          RADIOGRAPHIC STUDIES (from last 24 hours if applicable) I have personally reviewed the radiological images as listed and agreed with the findings in the report. No results found.      Visit Diagnosis: 1. Nausea and vomiting, unspecified vomiting type   2. Lung mass      Orders Placed This Encounter  Procedures   Ambulatory referral to Pulmonology    Referral Priority:   Urgent    Referral Type:   Consultation    Referral Reason:   Specialty Services Required    Requested Specialty:   Pulmonary Disease    Number of Visits Requested:   1  All questions were answered. The patient knows to call the clinic with any problems, questions or concerns. No barriers to learning was detected.  A total of more than 30 minutes were spent on this encounter with face-to-face time and non-face-to-face time, including preparing to see the patient, ordering tests and/or medications, counseling the patient and coordination of care as outlined above.    Thank you for allowing me to participate in the care of this patient.    Ivelis Norgard E  Walisiewicz, PA-C Department of Hematology/Oncology [facility] at [facility] Phone: [phone]  [phone]    07/21/2023 4:24  PM

## 2023-07-21 NOTE — Patient Instructions (Signed)
 You have been scheduled to see Lauraine Lites NP at [facility] pulmonology. The first available appointment they have is 07/28 at 11 am. If needed the direct pulmonology office number is [phone]

## 2023-07-22 ENCOUNTER — Telehealth: Payer: Self-pay | Admitting: Physician Assistant

## 2023-07-22 LAB — URINE CULTURE

## 2023-07-22 NOTE — Telephone Encounter (Signed)
 I notified Erika Baker by phone regarding UA results. US  shows large leukocytes otherwise no obvious infection. Urine culture shows multiple species present. Erika Baker denies any symptoms of a UTI currently and reports that her nausea has improved with the PO zofran . Will hold of on antibiotic at this time as she is asymptomatic of a UTI. Patient expressed understanding of the plan provided. Encouraged patient to RTC is symptoms worsen.

## 2023-07-27 ENCOUNTER — Encounter: Payer: Self-pay | Admitting: Medical Oncology

## 2023-07-27 DIAGNOSIS — C499 Malignant neoplasm of connective and soft tissue, unspecified: Secondary | ICD-10-CM | POA: Diagnosis not present

## 2023-07-27 DIAGNOSIS — C799 Secondary malignant neoplasm of unspecified site: Secondary | ICD-10-CM | POA: Diagnosis not present

## 2023-07-27 NOTE — Progress Notes (Signed)
 [facility]  Call to Dr. Henderson office regarding patient's biopsy results. Dr. Henderson office states biopsy has resulted and will fax to [facility].   Colene KYM Raider, RN, BSN, [facility] Oncology Nurse Navigator, [facility] 07/27/2023 4:11 PM

## 2023-07-27 NOTE — Telephone Encounter (Signed)
 Erika Baker Nurse Navigator - [facility], pt identifiers x2, asking if the biopsies that were sent to [facility] have resulted yet, states Dr Federico, referring provider is requesting the results be faxed to [phone]  Faxed biopsy results and Office note as requested.

## 2023-07-27 NOTE — Progress Notes (Signed)
--  20 minutes were spent during this established patient encounter.  The primary encounter diagnosis was Soft tissue sarcoma  left thigh   (CMD). 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  --More than half of that time was spent in a face to face manner in the examination room, coordinating the patient's orthopedic care, and counseling and educating the patient and family.  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  --Together we reviewed the pertinent physical findings on today's exam: skin intact over lesion in thigh  --Also we reviewed  clinical information from final path:  synovial cell sarcoma  --We discussed the significance of the xray findings  --Based on this information I communicated my impression that she will need gamma knife to brain met and chemo for lung/brain mets as well as possible XRT to thigh primary sarcoma  --Together we agreed to proceed with use of walker when standing  --will follow as needed since patitent's treatment will be at [facility] in [place]  ----  Medical History[1]  Surgical History[2]  Family History[3] Otherwise, no relevant orthopaedic family history.  ROS: COMPLETE REVIEW OF SYSTEMS was assessed and no findings were found to be pertinent to the current orthopedic problem except as otherwise mentioned in the assessment and plan and elsewhere in the medical record.       [1] Past Medical History: Diagnosis Date  . High cholesterol   . Hypertension   [2] Past Surgical History: Procedure Laterality Date  . ABDOMINAL HYSTERECTOMY    . SHOULDER SURGERY Right    cyst removal  [3] No family history on file.

## 2023-07-28 ENCOUNTER — Other Ambulatory Visit: Payer: Self-pay | Admitting: *Deleted

## 2023-07-28 ENCOUNTER — Encounter: Payer: Self-pay | Admitting: Medical Oncology

## 2023-07-28 DIAGNOSIS — M7989 Other specified soft tissue disorders: Secondary | ICD-10-CM

## 2023-07-28 NOTE — Progress Notes (Signed)
 [facility]  Late Entry: Received fax with biopsy results report from [facility]. Provided to Dr. Federico for his review.   Dr. Federico requesting patient to be seen by Dr. Deward Dines at [facility]. Call to [facility] to place the request. I was informed by office that per Dr. Luigi office note, from patient being seen yesterday, patient will be treated here at [facility] and [facility] to follow as needed and if we are requesting Dr. Dines, a new referral is needed. Dr. Federico informed.  New referral to Dr. Deward Dines requesting patient to be seen and treated at [facility], faxed.   Colene KYM Raider, RN, BSN, [facility] Oncology Nurse Navigator, [facility] 07/28/2023 3:28 PM

## 2023-07-29 ENCOUNTER — Encounter: Payer: Self-pay | Admitting: Medical Oncology

## 2023-07-29 NOTE — Progress Notes (Signed)
 [facility]  Outgoing call to patient to inform her to expect call from Dr. Marci at [facility] for appointment and to discuss treatment plans. Patient asked that I speak with her cousin, who was with her at the time of this phone call. Patient's cousin informed me that have heard from Dr. Roxy office and have an appointment scheduled to see him next week 7/16. Patient was informed that [facility] will be treating her cancer diagnosis, patient gave verbal understanding. Patient and cousin thanked for their time and were encouraged to call with further questions concerns.   Erika KYM Raider, RN, BSN, [facility] Oncology Nurse Navigator, [facility] 07/29/2023 3:30 PM

## 2023-07-29 NOTE — Progress Notes (Signed)
 [facility]  Call received from [facility], regarding yesterday's faxed referral for patient to be seen and treated by Dr. Marci, confirming receipt of faxed referral. I spoke with Luke, in medical records who confirmed understanding that patient was seen there and sarcoma workup was at [facility] with biopsy. Kim confirmed understanding and was thanked for her follow-up with. I encouraged her to call me with further questions.   Colene KYM Raider, RN, BSN, [facility] Oncology Nurse Navigator, [facility] 07/29/2023 10:04 AM

## 2023-08-03 ENCOUNTER — Ambulatory Visit: Attending: Student | Admitting: Physical Therapy

## 2023-08-03 ENCOUNTER — Encounter: Payer: Self-pay | Admitting: Occupational Therapy

## 2023-08-03 ENCOUNTER — Ambulatory Visit: Admitting: Occupational Therapy

## 2023-08-03 DIAGNOSIS — H532 Diplopia: Secondary | ICD-10-CM | POA: Insufficient documentation

## 2023-08-03 NOTE — Therapy (Signed)
 OCCUPATIONAL THERAPY ARRIVED - NO CHARGE   [facility] [address] [place], [place], [address] Phone: [phone]   Fax:  [phone]     Patient Details  Name: Erika Baker MRN: [id number] Date of Birth: [birth year] Referring Provider:  Kathrin Mignon ONEIDA, MD   Encounter Date: 08/03/2023   Pt arrives to PT and OT evals.  Pt reports severe pain in Lt lateral thigh since biopsy was done in June - states she will be having chemo in [place]?) tomorrow.  Reports she was referred for the diplopia only -currently wearing eyeglasses that have taped applied on medial side of lens. Pt confirms that referral is for diplopia only - not to address LE deficits or pain. Pt made pt comfortable and was observed to be sleeping at time of OT eval. She is visibly in severe pain and unable to remain in sitting position. Only reports pain relief in prone with L leg straightened.   OT discussed options with sister-in-law and pt including seeking emergent care for pain, which pt deferred, speaking with physician tomorrow to discuss pain management options, and discussing with physician the option for home health services given limitations with pain and transportation/mobility.   Pt then assisted into wheelchair and into car. Pt to call if she needs to reschedule.

## 2023-08-03 NOTE — Therapy (Signed)
 PHYSICAL THERAPY ARRIVED - NO CHARGE  [facility] [address] [place], [place], [address] Phone: [phone]   Fax:  [phone]   Patient Details  Name: Erika Baker MRN: [id number] Date of Birth: 04-07-1950 Referring Provider:  Kathrin Mignon ONEIDA, MD  Encounter Date: 08/03/2023  Pt arrives to PT eval with referral of diplopia - has both PT and OT referrals with this same diagnosis.  Pt reports severe pain in Lt lateral thigh since biopsy was done in June - states she will be having chemo in [place] tomorrow.  Reports she was referred for the diplopia only -currently wearing eyeglasses that have taped applied on medial side of lens. Pt confirms that referral is for diplopia only - not to address LE deficits or pain.   Pt is scheduled for OT eval at 11:00 today - needs OT services only, not PT with this referring diagnosis.  Roxanna [place], PT 08/03/2023, 10:33 AM  [facility] [address] [place], [place], [address] Phone: [phone]   Fax:  [phone]

## 2023-08-04 DIAGNOSIS — K5900 Constipation, unspecified: Secondary | ICD-10-CM | POA: Diagnosis not present

## 2023-08-04 DIAGNOSIS — C7931 Secondary malignant neoplasm of brain: Secondary | ICD-10-CM | POA: Diagnosis not present

## 2023-08-04 DIAGNOSIS — E871 Hypo-osmolality and hyponatremia: Secondary | ICD-10-CM | POA: Diagnosis not present

## 2023-08-04 DIAGNOSIS — M84452A Pathological fracture, left femur, initial encounter for fracture: Secondary | ICD-10-CM | POA: Diagnosis not present

## 2023-08-04 DIAGNOSIS — C7801 Secondary malignant neoplasm of right lung: Secondary | ICD-10-CM | POA: Diagnosis not present

## 2023-08-04 DIAGNOSIS — M79605 Pain in left leg: Secondary | ICD-10-CM | POA: Diagnosis not present

## 2023-08-04 DIAGNOSIS — Z515 Encounter for palliative care: Secondary | ICD-10-CM | POA: Diagnosis not present

## 2023-08-04 DIAGNOSIS — C7989 Secondary malignant neoplasm of other specified sites: Secondary | ICD-10-CM | POA: Diagnosis not present

## 2023-08-04 DIAGNOSIS — M25552 Pain in left hip: Secondary | ICD-10-CM | POA: Diagnosis not present

## 2023-08-04 DIAGNOSIS — C7802 Secondary malignant neoplasm of left lung: Secondary | ICD-10-CM | POA: Diagnosis not present

## 2023-08-04 DIAGNOSIS — I10 Essential (primary) hypertension: Secondary | ICD-10-CM | POA: Diagnosis not present

## 2023-08-04 DIAGNOSIS — G9389 Other specified disorders of brain: Secondary | ICD-10-CM | POA: Diagnosis not present

## 2023-08-04 DIAGNOSIS — Z66 Do not resuscitate: Secondary | ICD-10-CM | POA: Diagnosis not present

## 2023-08-04 DIAGNOSIS — C4922 Malignant neoplasm of connective and soft tissue of left lower limb, including hip: Secondary | ICD-10-CM | POA: Diagnosis not present

## 2023-08-04 DIAGNOSIS — E785 Hyperlipidemia, unspecified: Secondary | ICD-10-CM | POA: Diagnosis not present

## 2023-08-04 DIAGNOSIS — E883 Tumor lysis syndrome: Secondary | ICD-10-CM | POA: Diagnosis not present

## 2023-08-04 DIAGNOSIS — C499 Malignant neoplasm of connective and soft tissue, unspecified: Secondary | ICD-10-CM | POA: Diagnosis not present

## 2023-08-04 DIAGNOSIS — R918 Other nonspecific abnormal finding of lung field: Secondary | ICD-10-CM | POA: Diagnosis not present

## 2023-08-04 DIAGNOSIS — M898X5 Other specified disorders of bone, thigh: Secondary | ICD-10-CM | POA: Diagnosis not present

## 2023-08-04 DIAGNOSIS — N179 Acute kidney failure, unspecified: Secondary | ICD-10-CM | POA: Diagnosis not present

## 2023-08-04 DIAGNOSIS — R52 Pain, unspecified: Secondary | ICD-10-CM | POA: Diagnosis not present

## 2023-08-04 DIAGNOSIS — K7689 Other specified diseases of liver: Secondary | ICD-10-CM | POA: Diagnosis not present

## 2023-08-05 DIAGNOSIS — R918 Other nonspecific abnormal finding of lung field: Secondary | ICD-10-CM | POA: Diagnosis not present

## 2023-08-05 DIAGNOSIS — K7689 Other specified diseases of liver: Secondary | ICD-10-CM | POA: Diagnosis not present

## 2023-08-05 DIAGNOSIS — C4922 Malignant neoplasm of connective and soft tissue of left lower limb, including hip: Secondary | ICD-10-CM | POA: Diagnosis not present

## 2023-08-06 ENCOUNTER — Inpatient Hospital Stay: Admitting: Dietician

## 2023-08-06 NOTE — Progress Notes (Signed)
 Patient scheduled for nutrition appointment via telephone. Chart reviewed. Patient currently admitted ([facility]). Appointment cancelled. Will plan to reschedule as able.

## 2023-08-16 ENCOUNTER — Ambulatory Visit: Admitting: Acute Care

## 2023-08-20 NOTE — Discharge Summary (Signed)
-------------------------------------------------------------------------------   Attestation signed by Jereld Casino, MD at 08-14-2023  3:44 PM I have personally interviewed and examined the patient. I have reviewed the documentation of Dr. Cristobal and concur with the history, findings, and plans for management. I have discussed the pertinent aspects of the patient's medical problems with the patient and have made recommendations as appropriate. At the time of Dr Cristobal rounds this am the patient was found altered, minimally response and severely hypoxic with O2 sat in the 60s and severely hypotensive. A family member (cousin) was in the room. [facility] was called and supportive care was promptly adm - O2, fluids, Levophed, Narcan. Despite these interventions the patient progressed towards agonal breaths. As the patient expressed wished for DNR/L on admission, CPR was not initiated. Erika Baker was pronounced dead at 8.15 am. More family members came and I discussed possible causes and the very poor prognostic with her type and wide spread of her cancer. Expressed condolences. Death certificate was signed  More than 35 minutes total time was spent with the event and the patient was d/c ed as deceased.  Jereld Casino, MD   -------------------------------------------------------------------------------  [facility] Discharge Summary PATIENT DISCHARGED DECEASED  Name: Erika Baker MRN: [id number] Age: 73 yrs DOB: 1950-09-16  Admission: 08/04/2023 Admitting Physician: Donnice Fairy Blalock, MD  Discharge: Aug 14, 2023  Discharge Physician: Jereld Casino, MD   Admission Diagnoses:  Intractable pain [R52]   Discharge Diagnoses:  Metastatic synovial sarcoma Intractable pain AKI  Admission Condition: poor Discharged Condition: deceased  Indication for Hospitalization and Brief Hospital Course: For full details, please see H&P, progress notes, consult notes  and ancillary notes. Briefly, Erika Baker is a 73 y.o. female with a medical history significant for newly diagnosed synovial sarcoma of LLE, HTN, HLD. She presented on 08/04/2023 with a chief complaint of intractable pain.  She was treated for this complaint. The details of his hospital stay are summarized below in a problem based format.   #Metastatic synovial sarcoma #Intractable pain #AKI #Hyponatremia  Patient with recently diagnosed synovial sarcoma of the left thigh who presented from clinic intractable pain. Recently admitted at OSH for pain and new diplopia and ptosis. AKI to 3.8 and hyponatremia to 126 at time of presentation in setting of poor PO intake, both of which improved with IV fluid. Pain treated with IV Dilaudid and PO oxycodone . Imaging showing large LLE mass with central necrosis and peripheral hypermetabolic activity along with extensive bony and soft tissue metastases, predominantly involving the lung parenchyma and pleura bilaterally, as well as a mass in the clivus. Orthopaedic consultation confirmed cortical thinning of the femur with concern for impending pathologic fracture, recommending non-weightbearing status.  On the morning of 08-14-2023, patient found to be minimally responsive.  She was found to be hypoxic to the 60s and hypotensive to the 50s/30s.  Rapid response was called and oxygen, Narcan, fluids and Levophed were administered. Ultimately, spontaneous respirations ceased and pulse was lost. Patient with DNR/Limited code status as discussed on admission, so further resuscitation was not attempted. The patient was pronounced dead on 08/14/2023 at 8:15 AM.  Discharge Follow-up Action Items: Patient discharged as deceased  Disposition: Deceased   Patient's Ordered Code Status: DNAR / Limited SOTO    To request medical records from this hospitalization, please refer to [email or web address] or call [phone].  Electronically signed by:  Alyce Belvie Cristobal, MD 14-Aug-2023

## 2023-08-20 DEATH — deceased

## 2023-12-08 ENCOUNTER — Encounter: Payer: Self-pay | Admitting: Oncology
